# Patient Record
Sex: Female | Born: 1970 | Race: White | Hispanic: No | Marital: Married | Smoking: Current every day smoker
Health system: Southern US, Community
[De-identification: ages and names within clinical notes are randomized; demographics above are authoritative.]

## PROBLEM LIST (undated history)

## (undated) HISTORY — PX: ABDOMINAL HYSTERECTOMY: SHX81

## (undated) HISTORY — PX: TUBAL LIGATION: SHX77

---

## 1997-07-26 ENCOUNTER — Ambulatory Visit (HOSPITAL_COMMUNITY): Admission: RE | Admit: 1997-07-26 | Discharge: 1997-07-26 | Payer: Self-pay | Admitting: Orthopedic Surgery

## 1997-07-28 ENCOUNTER — Emergency Department (HOSPITAL_COMMUNITY): Admission: EM | Admit: 1997-07-28 | Discharge: 1997-07-28 | Payer: Self-pay

## 1997-07-29 ENCOUNTER — Ambulatory Visit (HOSPITAL_COMMUNITY): Admission: RE | Admit: 1997-07-29 | Discharge: 1997-07-29 | Payer: Self-pay | Admitting: Internal Medicine

## 1997-07-31 ENCOUNTER — Inpatient Hospital Stay (HOSPITAL_COMMUNITY): Admission: EM | Admit: 1997-07-31 | Discharge: 1997-08-02 | Payer: Self-pay | Admitting: Orthopedic Surgery

## 1997-12-23 ENCOUNTER — Encounter: Admission: RE | Admit: 1997-12-23 | Discharge: 1998-03-23 | Payer: Self-pay | Admitting: Anesthesiology

## 2000-03-18 ENCOUNTER — Other Ambulatory Visit: Admission: RE | Admit: 2000-03-18 | Discharge: 2000-03-18 | Payer: Self-pay | Admitting: Obstetrics and Gynecology

## 2000-03-21 ENCOUNTER — Encounter (INDEPENDENT_AMBULATORY_CARE_PROVIDER_SITE_OTHER): Payer: Self-pay

## 2000-03-21 ENCOUNTER — Other Ambulatory Visit: Admission: RE | Admit: 2000-03-21 | Discharge: 2000-03-21 | Payer: Self-pay | Admitting: Obstetrics and Gynecology

## 2000-09-12 ENCOUNTER — Inpatient Hospital Stay (HOSPITAL_COMMUNITY): Admission: AD | Admit: 2000-09-12 | Discharge: 2000-09-13 | Payer: Self-pay | Admitting: Obstetrics & Gynecology

## 2001-06-23 ENCOUNTER — Encounter: Admission: RE | Admit: 2001-06-23 | Discharge: 2001-06-23 | Payer: Self-pay | Admitting: Internal Medicine

## 2001-09-01 ENCOUNTER — Encounter: Admission: RE | Admit: 2001-09-01 | Discharge: 2001-09-01 | Payer: Self-pay | Admitting: *Deleted

## 2001-09-01 ENCOUNTER — Other Ambulatory Visit: Admission: RE | Admit: 2001-09-01 | Discharge: 2001-09-01 | Payer: Self-pay | Admitting: *Deleted

## 2001-09-20 ENCOUNTER — Ambulatory Visit (HOSPITAL_COMMUNITY): Admission: RE | Admit: 2001-09-20 | Discharge: 2001-09-20 | Payer: Self-pay | Admitting: *Deleted

## 2001-09-20 ENCOUNTER — Encounter (INDEPENDENT_AMBULATORY_CARE_PROVIDER_SITE_OTHER): Payer: Self-pay

## 2001-10-13 ENCOUNTER — Encounter: Admission: RE | Admit: 2001-10-13 | Discharge: 2001-10-13 | Payer: Self-pay | Admitting: *Deleted

## 2002-07-02 ENCOUNTER — Encounter: Admission: RE | Admit: 2002-07-02 | Discharge: 2002-07-02 | Payer: Self-pay | Admitting: Internal Medicine

## 2002-10-18 ENCOUNTER — Encounter: Admission: RE | Admit: 2002-10-18 | Discharge: 2002-10-18 | Payer: Self-pay | Admitting: Internal Medicine

## 2004-09-14 ENCOUNTER — Ambulatory Visit (HOSPITAL_COMMUNITY): Admission: RE | Admit: 2004-09-14 | Discharge: 2004-09-14 | Payer: Self-pay | Admitting: Emergency Medicine

## 2004-09-14 ENCOUNTER — Emergency Department (HOSPITAL_COMMUNITY): Admission: EM | Admit: 2004-09-14 | Discharge: 2004-09-14 | Payer: Self-pay | Admitting: Emergency Medicine

## 2004-09-22 ENCOUNTER — Other Ambulatory Visit: Admission: RE | Admit: 2004-09-22 | Discharge: 2004-09-22 | Payer: Self-pay | Admitting: Obstetrics & Gynecology

## 2004-10-14 ENCOUNTER — Ambulatory Visit (HOSPITAL_COMMUNITY): Admission: RE | Admit: 2004-10-14 | Discharge: 2004-10-14 | Payer: Self-pay | Admitting: Obstetrics & Gynecology

## 2004-11-25 ENCOUNTER — Encounter: Admission: RE | Admit: 2004-11-25 | Discharge: 2004-11-25 | Payer: Self-pay | Admitting: Gastroenterology

## 2004-12-07 ENCOUNTER — Encounter (INDEPENDENT_AMBULATORY_CARE_PROVIDER_SITE_OTHER): Payer: Self-pay | Admitting: Specialist

## 2004-12-07 ENCOUNTER — Inpatient Hospital Stay (HOSPITAL_COMMUNITY): Admission: RE | Admit: 2004-12-07 | Discharge: 2004-12-09 | Payer: Self-pay | Admitting: Obstetrics & Gynecology

## 2005-10-01 ENCOUNTER — Emergency Department (HOSPITAL_COMMUNITY): Admission: EM | Admit: 2005-10-01 | Discharge: 2005-10-01 | Payer: Self-pay | Admitting: Emergency Medicine

## 2005-10-04 ENCOUNTER — Encounter (HOSPITAL_COMMUNITY): Admission: RE | Admit: 2005-10-04 | Discharge: 2005-11-04 | Payer: Self-pay | Admitting: Emergency Medicine

## 2008-02-05 ENCOUNTER — Emergency Department (HOSPITAL_COMMUNITY): Admission: EM | Admit: 2008-02-05 | Discharge: 2008-02-05 | Payer: Self-pay | Admitting: Emergency Medicine

## 2008-02-06 ENCOUNTER — Encounter (INDEPENDENT_AMBULATORY_CARE_PROVIDER_SITE_OTHER): Payer: Self-pay | Admitting: Obstetrics & Gynecology

## 2008-02-06 ENCOUNTER — Observation Stay (HOSPITAL_COMMUNITY): Admission: AD | Admit: 2008-02-06 | Discharge: 2008-02-07 | Payer: Self-pay | Admitting: Obstetrics & Gynecology

## 2008-08-10 ENCOUNTER — Emergency Department (HOSPITAL_COMMUNITY): Admission: EM | Admit: 2008-08-10 | Discharge: 2008-08-10 | Payer: Self-pay | Admitting: Emergency Medicine

## 2010-03-14 ENCOUNTER — Encounter: Payer: Self-pay | Admitting: Gastroenterology

## 2010-06-01 LAB — URINALYSIS, ROUTINE W REFLEX MICROSCOPIC
Glucose, UA: NEGATIVE mg/dL
Ketones, ur: 15 mg/dL — AB
Nitrite: NEGATIVE
Protein, ur: NEGATIVE mg/dL
Specific Gravity, Urine: 1.02 (ref 1.005–1.030)
Urobilinogen, UA: 1 mg/dL (ref 0.0–1.0)
pH: 6 (ref 5.0–8.0)

## 2010-06-01 LAB — POCT I-STAT, CHEM 8
BUN: 10 mg/dL (ref 6–23)
Calcium, Ion: 1.11 mmol/L — ABNORMAL LOW (ref 1.12–1.32)
Chloride: 105 mEq/L (ref 96–112)
Creatinine, Ser: 0.9 mg/dL (ref 0.4–1.2)
Glucose, Bld: 80 mg/dL (ref 70–99)
HCT: 39 % (ref 36.0–46.0)
Hemoglobin: 13.3 g/dL (ref 12.0–15.0)
Potassium: 3.2 mEq/L — ABNORMAL LOW (ref 3.5–5.1)
Sodium: 141 mEq/L (ref 135–145)
TCO2: 26 mmol/L (ref 0–100)

## 2010-06-01 LAB — GC/CHLAMYDIA PROBE AMP, GENITAL
Chlamydia, DNA Probe: NEGATIVE
GC Probe Amp, Genital: NEGATIVE

## 2010-06-01 LAB — URINE MICROSCOPIC-ADD ON

## 2010-06-01 LAB — WET PREP, GENITAL
Trich, Wet Prep: NONE SEEN
WBC, Wet Prep HPF POC: NONE SEEN
Yeast Wet Prep HPF POC: NONE SEEN

## 2010-06-01 LAB — RPR: RPR Ser Ql: NONREACTIVE

## 2010-06-01 LAB — PREGNANCY, URINE: Preg Test, Ur: NEGATIVE

## 2010-07-07 NOTE — Op Note (Signed)
Pam, Warner        ACCOUNT NO.:  [id number]   MEDICAL RECORD NO.:  [id number]          PATIENT TYPE:  OBV   LOCATION:  9307                          FACILITY:  [facility]   PHYSICIAN:  [name], M.D.   DATE OF BIRTH:  January 17, 1971   DATE OF PROCEDURE:  02/06/2008  DATE OF DISCHARGE:                               OPERATIVE REPORT   PREOPERATIVE DIAGNOSIS:  Painful pelvic mass consistent with a torsed  right ovary.   POSTOPERATIVE DIAGNOSIS:  Hemorrhagic right ovarian cyst.   PROCEDURE:  Laparoscopic right salpingo-oophorectomy, cystoscopy   SURGEON:  Pam Mori, MD   ASSISTANT:  Gerrit Friends. Aldona Bar, MD   ANESTHESIA:  General endotracheal.   ESTIMATED BLOOD LOSS:  Minimal.   COMPLICATIONS:  None.   FINDINGS:  There was a 10-cm right ovarian cyst in the central lower  pelvic area.  This cyst was densely adherent to underlying bowel and to  the pelvic sidewall.  No evidence of torsion was seen.  The rest of the  pelvis appeared normal with no lesions noted.   INDICATIONS:  This is a 40 year old gravida 2, para 2, female status  post total abdominal hysterectomy and left salpingo-oophorectomy for  pelvic pain and adenomyosis approximately 3 years ago, who presented  with acute onset of severe lower abdominal pain for approximately 48  hours.  The patient denied fevers or chills.  Denied constipation or  diarrhea, though she had some nausea which she related to the pain.  She  was seen in the emergency room on the evening of December 14, where a  CAT scan showed a 10-cm pelvic mass.  Diagnosis of ovarian cyst was  made.  The patient was stable on pain medication and returned to the  office on December 15 for an ultrasound.  The ultrasound confirmed the  presence of the mass.  The patient still complained of moderate-to-  severe pain, which was just barely controlled with oral narcotic  medication.  The diagnosis of torsed adnexal mass was entertained. The  decision  was made to proceed with immediate surgery based upon the fact  that this was perhaps a torsion and that the patient was in a  significant amount of pain not controlled with oral analgesia.   PROCEDURE IN DETAIL:  The patient was taken to the operating room and  general endotracheal anesthesia was induced.  She was placed in the  modified dorsal lithotomy position.  The abdomen and perineum were  prepped and draped in the sterile fashion.  The bladder was  catheterized.  The surgery regowned and gloved.  An incision was made at  the base of the umbilicus and an open entry into the peritoneal cavity  was performed.  A Hasson cannula was then placed and then the  laparoscope was introduced.  The pneumoperitoneum was created.  Accessory instruments were placed in the left and right lower quadrants.  The pelvis was viewed with the findings noted above.  The pelvic mass  was mobilized and adhesions were noted.  These adhesions were taken down  sharply with the scissors.  The infundibulopelvic ligament was  identified, cauterized, and cut  with a tripolar instrument and this was  continued out until the infundibular ligaments were completely severed.  The cyst was still densely adherent to the right pelvic sidewall and the  dissection was carried out as close as possible to the side wall without  injuring any vital structures or the underlying bowel, which was also  densely adherent to the cyst.  Finally, the cyst was freed from its  adhesions and vasculature, and was placed in the cul-de-sac.  The pelvis  was viewed.  There appeared to be the possibility of some ovarian tissue  remnant firmly adherent to the right pelvic sidewall.  A decision was  made not to attempt to dissect these free for fear of creating further  injury.  The cyst was then placed in an Endobag, which was placed  through the umbilical incision, while a 5-mm camera was placed through  the right lower quadrant port.  The bag  was then brought through the  umbilical incision and the specimen was morcellated and the remaining  specimen and the endobag was removed intact.  The Hasson cannula was  then replaced and the 10-mm scope was replaced, so that the pelvis could  be viewed.  The operative bed was thoroughly irrigated and was noted to  be hemostatic, and the rest of the pelvis was viewed and noticed to be  without lesions.  At that point, the procedure was terminated.  The gas  was allowed to escape.  The umbilical fascia was closed with a 0 Prolene  suture.  The skin at the umbilical incision was closed with subcuticular  4-0 Vicryl suture.  The left and right lower quadrant 5-mm ports were  closed with Dermabond.  The decision was made to proceed with cystoscopy  because of the inability to visualize the ureter at the site of the  right adnexal surgery.  The indigo carmine dye was placed intravenously  and the cystoscope was placed.  The ureteral orifices were easily  visualized and fluid was clearly seen pulsating through both the  ureteral orifices indicating patency. A small polyp was also noted on  the anterior bladder wall. The cystoscopy was then terminated at that  point as was the general procedure.  The patient left the operating room  in good condition.      [name], M.D.  Electronically Signed     RK/MEDQ  D:  02/06/2008  T:  02/07/2008  Job:  [id number]

## 2010-07-10 NOTE — Op Note (Signed)
NAMEBREYONNA, NAULT        ACCOUNT NO.:  [id number]   MEDICAL RECORD NO.:  [id number]          PATIENT TYPE:  AMB   LOCATION:  SDC                           FACILITY:  [facility]   PHYSICIAN:  Gerrit Friends. Aldona Bar, M.D.   DATE OF BIRTH:  1970/08/14   DATE OF PROCEDURE:  12/07/2004  DATE OF DISCHARGE:                                 OPERATIVE REPORT   PREOPERATIVE DIAGNOSIS:  Persistent left lower quadrant pain, suspect  adenomyosis.   POSTOPERATIVE DIAGNOSES:  Persistent left lower quadrant pain, suspect  adenomyosis.  Pathology pending.   PROCEDURE:  Total abdominal hysterectomy, left salpingo-oophorectomy,  removal of right Falope ring.   SURGEON:  Gerrit Friends. Aldona Bar, M.D.   ASSISTANT:  Dr. Malva Limes.   ANESTHESIA:  Was general endotracheal.   PROCEDURE:  The patient was taken to the operating where after satisfactory  induction of general endotracheal anesthesia, she was prepped and draped  having been placed in the supine position. Foley catheter was placed as part  of prep.   Once the patient had been adequately draped, procedure was begun.   A Pfannenstiel incision was made and with minimal difficulty dissected down  sharply to and through the fascia in low transverse fashion with hemostasis  created at each layer. Subfascial space was created inferiorly and  superiorly, muscles separated midline. Peritoneum identified and entered  appropriately with care taken to avoid the bowel superiorly and the bladder  inferiorly. Once the peritoneum was opened, the abdomen was inspected. The  appendix was palpably and visibly normal as was the upper abdomen. At this  time the self-retaining O'Connor-O'Sullivan retractor was placed and bowel  was packed off without difficulty. The pelvis looked for the most part  normal although the uterus did have a somewhat mottled appearance not  totally and consistent with adenomyosis. At this time hysterectomy was begun  in usual fashion. Two  long Kelly clamps were placed at the corners of the  uterus and with minimal difficulty, the round ligaments bilaterally were  suture secured and divided creating a bladder flap anteriorly. At this time  the left infundibulopelvic pedicle was identified, skeletonized - small  bowel adhesions were sharply taken down and after identification of the left  ureter, the infundibulopelvic pedicle on the left was clamped, cut and  doubly suture secured with 0 Vicryl suture. A decision was made to leave the  right ovary in as previously discussed with the patient but remove the right  Falope ring. Accordingly a clamp was placed on the ovarian ligament  encompassing the Falope ring on the right fallopian tube and after that  pedicle was cut, it was doubly suture secured with 0 Vicryl suture as well.  At this time the uterine artery pedicles were skeletonized and then using  curved clamps, the pedicle developed, suture secured with 0 Vicryl suture.  Bilateral pedicles were taken in similar fashion, down to the level of the  vagina and cervix in a similar fashion. The vaginal angles were then secured  and Heaney sutured with 0 Vicryl suture. The uterus and cervix were then  removed and the vaginal cuff closed with  interrupted 0 Vicryl in figure-of-  eight fashion. The ureters had been appropriately identified bilaterally.  Irrigation was carried out and hemostasis created. At this time procedure  was felt to be complete. The right ovary was well suspended to the right  round ligament pedicle. At this time all packs were removed. All counts  noted to be correct. No foreign bodies noted in the abdominal cavity and  closure of the abdomen was begun in layers. The abdominal peritoneum was  closed with 0 Vicryl running fashion. Muscles secured with same. Assured of  good subfascial hemostasis, the fascia was then reapproximated using 0  Vicryl from angle to midline bilaterally. Assured of good subcutaneous   hemostasis, the skin was then closed with staples and sterile pressure  dressing was applied. The patient at this time was transported to recovery  room in satisfactory having tolerated the procedure well. Estimated blood  loss 150 mL.  All counts correct x2. The patient was taken to the recovery  in satisfactory condition.      Gerrit Friends. Aldona Bar, M.D.  Electronically Signed     RMW/MEDQ  D:  12/07/2004  T:  12/07/2004  Job:  [id number]

## 2010-07-10 NOTE — Op Note (Signed)
NAMEHANNELORE, BOVA        ACCOUNT NO.:  [id number]   MEDICAL RECORD NO.:  [id number]          PATIENT TYPE:  AMB   LOCATION:  SDC                           FACILITY:  [facility]   PHYSICIAN:  Gerrit Friends. Aldona Bar, M.D.   DATE OF BIRTH:  September 08, 1970   DATE OF PROCEDURE:  10/14/2004  DATE OF DISCHARGE:                                 OPERATIVE REPORT   PREOPERATIVE DIAGNOSIS:  Chronic left lower quadrant pain.   POSTOPERATIVE DIAGNOSIS:  Chronic left lower quadrant pain.   PROCEDURE:  Diagnostic laparoscopy.   SURGEON:  Gerrit Friends. Aldona Bar, M.D.   ANESTHESIA:  General endotracheal.   PROCEDURE:  The patient was taken to the operating room, where after the  satisfactory induction of general endotracheal anesthesia she was prepped  and draped having been in the short Allen stirrups in the modified lithotomy  position in the usual manner for laparoscopy.  The bladder was drained of  clear urine with a red rubber catheter in and in-and-out fashion as part of  the prep and a Hulka tenaculum applied and cervix for uterine mobility  during the procedure.   Laparoscopy was begun usual fashion.  A 1 cm subumbilical midline transverse  skin incision was made and through this the trocar sleeve were introduced.  The trocar was removed and through the sleeve the laparoscope introduced  with good visualization of intra-abdominal structures.  Pneumoperitoneum was  created at this time with approximately 3 liters of carbon dioxide gas.   The abdomen and pelvis were inspected.  This was aided by placement of an  accessory trocar through a 5 mm midline suprapubic transverse skin incision,  through which the accessory trocar and sleeve was introduced without  difficulty under direct visualization.  Once the accessory trocar was  removed, through the accessory sleeve a grasping instrument was introduced.  Pelvic structures were then inspected thoroughly.  Liver edge and the  surface of the diaphragm  appeared normal.  The appendix was not visualized,  presumably very retrocecal and unable to be mobilized.  There was a spot of  what appeared to be endometriosis on the right pelvic sidewall very much  away from where the patient was complaining of chronic left lower quadrant  pain.  The uterus appeared a little mottled in appearance but was in general  upper limits of normal size and mobile.  Bladder flap was without lesion, as  was the cul-de-sac.  There was some physiologic fluid noted in the cul-de-  sac emanating from the right ovary, which had a corpus luteum cyst,  consistent with the patient's menstrual cycle.  There was a Falope ring  appropriately located on the right fallopian tube and a second Falope ring  on the left fallopian tube, more in the area just adjacent to the cornu of  the uterus.  There was one small adhesion between that Falope ring, which  was essentially was covered over by tubal epithelium, and the round  ligament, and this was sharply taken down with excellent hemostasis.  The  left ovary, right ovary, posterior leaves of both broad ligaments, both  fallopian tubes, intestinal contents essentially  appeared completely normal.  There was no adequate exhalation to the patient's left lower quadrant pain  unless a small adhesion between the placement of the Falope ring on the left  fallopian tube and the left round ligament was a possible etiology.  This  adhesion was so small incision and so easily taken down that I am unsure as  to whether this could explain the patient's chronic pain.   At this time the physiologic fluid in the cul-de-sac was aspirated through  the accessory sleeve with the appropriate instrument and thereafter 0.5%  Marcaine was instilled into the cul-de-sac and over the area of the left  tube and ovary, an amount amounting to approximately 30 mL was instilled.  At this time as no pathology essentially appreciated, the procedure was felt  to  be complete and was terminated.  The accessory sleeve was removed under  direct visualization and the undersurface of the peritoneum was dry.  The  pneumoperitoneum was reduced.  The accessory incision under the peritoneum  remained dry.  The laparoscope was removed.  Pneumoperitoneum totally reduced, large sleeve  removed, and skin incisions were closed with 3-0 Vicryl in interrupted  subcuticular fashion.  Hulka tenaculum was removed from the cervix and the  patient at this time was transported to recovery in satisfactory condition,  having tolerated the procedure well.   The patient will be discharged home with prescription for Tylox to use one  or two every four to six hours as needed for pain and Phenergan 25 mg rectal  suppositories to use one every four hours per rectum as needed for nausea  and vomiting.  She will be asked to return the office in one week's time for  follow-up or as needed.  Appropriate instructions for the incision sites  will likewise be given to the patient.   In summary, the only pathologic findings that were noted were a spot of  endometriosis on the right pelvic sidewall and one very small adhesion  between the placement of the Falope ring on the left fallopian tube and the  left round ligament, which was taken down.  There was a physiologic corpus  luteum on the right ovary, and there was some physiologic fluid in the cul-  de-sac which was aspirated and discarded.   Condition on arrival to recovery satisfactory.      Gerrit Friends. Aldona Bar, M.D.  Electronically Signed     RMW/MEDQ  D:  10/14/2004  T:  10/14/2004  Job:  [id number]

## 2010-07-10 NOTE — H&P (Signed)
NAMEMARGART, ZEMANEK        ACCOUNT NO.:  [id number]   MEDICAL RECORD NO.:  [id number]          PATIENT TYPE:  AMB   LOCATION:  SDC                           FACILITY:  [facility]   PHYSICIAN:  Gerrit Friends. Aldona Bar, M.D.   DATE OF BIRTH:  11-02-1970   DATE OF ADMISSION:  10/14/2004  DATE OF DISCHARGE:                                HISTORY & PHYSICAL   OFFICE NUMBER:  [id number].   This patient is having surgery on the 23rd of August at [facility] at 12:30  p.m.   HISTORY:  [name] is a 40 year old, gravida 2, para 2, white  female undergoing diagnostic laparoscopy for left lower quadrant pain.   This patient was seen by me for the first time in several years on the 31st  of July, relating left lower quadrant pain for about a 4 month duration.  Apparently she was seen at Urgent Care and referred to urology because of  findings consistent with possibility of a kidney stone in the left ureter.  She had a work-up which consisted of a CT of the pelvis and abdomen without  contrast and subsequent to that had what sounds like an IVP done by Dr.  Barron Alvine which essentially ruled out a ureteral stone on the left even  though the patient has persistent microscopic hematuria.  She has had a  tubal sterilization procedure - this was done in the year 2003,  laparoscopically, with placement of Falope rings.   She has been having fairly regular cycles - she had a period on the 10th of  July and another period most recently around the 10th of August - her cycles  are regular but occasionally heavy with clotting.  This pain she describes  as coming and going, worse with movement, and worse she is on her feet and  seems to be confined to the left flank and left lower quadrant area.   When I saw her on the 31st of July, other than tenderness in the left  adnexal area, her exam was unremarkable.  She did have cultures done at that  visit, and both were negative.  She likewise had a CBC which  was  unremarkable and a comprehensive metabolic profile which was unremarkable.  An ultrasound also on that date revealed a left ovary measuring 3.3 cm x  1.97 cm x 2.32 cm, and there was a small cyst in the left ovary.  The  endometrial thickness was normal, and the right ovary appeared normal.   As mentioned, she did have a CT of the abdomen without contrast coupled with  a CT of the pelvis without contrast on the 24th of July, and no definite  distal ureteral stones were seen, and there was a small left ovarian cyst,  consistent with what was seen on her more recent ultrasound that was seen.   After seeing her on the 31st of July because of the microscopic hematuria, I  did have her return and see Dr. Isabel Caprice after I discussed with Dr. Isabel Caprice my  concerns, and patient returned to see me again on August 7th after having  seen Dr. Isabel Caprice  a second time and definitely ruling out, to the best of his  ability, any urologic problem, even though she continues to have microscopic  hematuria.  When I saw her on August 7th, her exam was relatively  unremarkable, although she was still complaining of some left lower quadrant  pain, saying that she could not work because of left lower quadrant pain.  Serum pregnancy test was done and was completely negative.   She began her period several days later and called back as directed because  of persistent discomfort, requesting a laparoscopy which I had previously  discussed with her.  She is now being taken to the operating room for a  diagnostic laparoscopy to rule out any gynecologic pathology.  It is of note  that a second ultrasound was done at her preoperative visit in the office on  the 17th of August.  And again, no significant pathology was seen.  The  right ovary actually had a cyst also at this time, but the cyst was small.  Both ovaries really essentially appeared normal.  There was no free fluid,  and the endometrial thickness was normal as  was the contour of the uterus.   The patient is now being taken to the operating room for a diagnostic  laparoscopy because of persistent left lower quadrant pain.   ALLERGIES:  SULFA.  She is also allergic to some type of CONTRAST FOR  RADIOLOGIC PROCEDURES.   PREVIOUS OPERATIVE PROCEDURES:  Laparoscopic tubal done in 2003 - Falope  rings.   Family history, social history, review of systems noncontributory with the  exception of above.   PHYSICAL EXAMINATION AT TIME OF ADMISSION:  GENERAL:  A well-developed  female who does not appear to be in any acute distress.  VITAL SIGNS:  Weight 154, height 5 feet 10.5 inches.  Blood pressure 110/62,  pulse 80 and regular, respirations 18 and regular, temperature 98.2.  HEENT:  Negative.  Thyroid not enlarged.  CHEST:  Clear.  CARDIOVASCULAR:  No murmur.  BREASTS:  Negative.  ABDOMEN:  Essentially unremarkable except for some deep tenderness in the  left lower quadrant.  There is no rebound.  Bowel sounds are well heard.  PELVIC EXAMINATION:  The uterus mobile, nontender, and both adnexal areas  are relatively nontender and feel normal with no abnormal masses palpated.  EXTREMITIES:  Negative.  NEUROLOGIC:  Physiologic.   IMPRESSION:  Chronic left lower quadrant pain.   PLAN:  Diagnostic laparoscopy.      Gerrit Friends. Aldona Bar, M.D.  Electronically Signed     RMW/MEDQ  D:  10/09/2004  T:  10/09/2004  Job:  [id number]

## 2010-07-10 NOTE — H&P (Signed)
NAMEAMRITHA, Warner        ACCOUNT NO.:  [id number]   MEDICAL RECORD NO.:  [id number]          PATIENT TYPE:  AMB   LOCATION:  SDC                           FACILITY:  [facility]   PHYSICIAN:  Gerrit Friends. Aldona Bar, M.D.   DATE OF BIRTH:  06-Nov-1970   DATE OF ADMISSION:  12/07/2004  DATE OF DISCHARGE:                                HISTORY & PHYSICAL   OFFICE NUMBER:  [id number].   HISTORY:  [name] is a 40 year old, gravida 2, para 2, female  being admitted for a total abdominal hysterectomy and left salpingo-  oophorectomy with a preoperative diagnosis of possible/probable adenomyosis,  endometriosis, and left lower quadrant pain.   Her history begins in July of 2006 when she had the onset of left lower  quadrant pain and presented to an urgent care center.  She has a history of  ureteral stones, and this is what, indeed, she thought she had at the time  she presented to the urgent care center.  She did have some microscopic  hematuria, in addition.  She had a work-up which included a CT which was  apparently negative for stones.  Nonetheless, she was seen by urology, and,  in spite of her microscopic hematuria, had a relatively negative evaluation.  She, thereafter, saw me, thinking that she was having some left lower  quadrant pain related to her left ovary.  She had had a previous  sterilization procedure done elsewhere about 3 years ago - a laparoscopic  Falope ring procedure, and, otherwise, with the exception of her 2 normal  spontaneous deliveries 4 and 8 years earlier, had a relatively uncomplicated  gynecologic history.  She did relate heavy periods with clotting and  cramping, but this left lower quadrant pain was something that was really  kind of additional.  She also related some constipation, but did not really  seem to associate the left lower quadrant pain with the constipation.  When  she was seen by me initially at the very end of July, again she had  microscopic hematuria, and she did have some minimal tenderness in the left  adnexa.  I did have her re-seen by Dr. Isabel Caprice, her urologist, think that her  history was more compatible with a stone than otherwise, and his further  evaluation likewise, again, revealed nothing to suggest a ureteral stone.  She returned to see me again about 10 days later with continued left lower  quadrant pain, to the point where ultimately after several normal  ultrasounds she was taken to the operating room on October 14, 2004 for a  diagnostic laparoscopy.  The uterus had a somewhat mottled appearance, not  totally inconsistent with adenomyosis.  The left ovary looked pretty  reasonable, as did the right ovary.  There was one old scarred area on the  pelvic side wall on the right that was consistent with mild endometriosis.  Otherwise, a laparoscopic evaluation was fairly unremarkable.  Unfortunately, several days after laparoscopy, the patient's left lower  quadrant pain returned.  Her pain has been such that she really claims to be  totally unable to work - she works as  a Production designer, theatre/television/film at night - and  has essentially been out of work since the onset of this problem at the end  of July.  When she was seen by me postoperatively on October 21, 2004, the  decision was made to try her on a low-dose birth control pill to see if this  had any effect on her lower abdominal pain.  Other than some breakthrough  bleeding initially when she started the low-dose birth control pill, she has  done fairly well, although she does admit to having a period when she is  supposed to, albeit light, but says that during that time she does have some  fairly impressive cramping, not dissimilar to what she had prior to starting  the birth control pill.  In addition, with some exertion, she continues to  have some left lower quadrant discomfort.  In September, she did undergo a  fairly thorough gastrointestinal investigation by  Dr. Madilyn Fireman, and it was his  feeling that she had some symptoms related to her fairly significant  constipation, and she was begun on Zelnorm, and basically had some  improvement of her left lower quadrant discomfort, although, as mentioned,  continued to have problems when she was having vaginal bleeding in terms of  not only left lower quadrant pain, but midline lower abdominal pain, as  well.  She returned to see me on December 02, 2004, really essentially  requesting some definite surgical intervention.  When she was seen by me  prior, we did talk about the possibility of an abdominal hysterectomy and a  left salpingo-oophorectomy.  After further talking with her in great detail  on December 02, 2004, what previously had been scheduled as a left salpingo-  oophorectomy on December 07, 2004 was changed to a total abdominal  hysterectomy and left salpingo-oophorectomy with the feeling that the  patient probably has some adenomyosis/endometriosis and possibly involvement  of the left ovary, as well.  So the plan is to remove her uterus  abdominally, take out her left tube and ovary, and remove the Falope rings  from the right fallopian tube, hopefully retaining her right ovary, with a  preoperative diagnosis of persistent dysmenorrhea and left lower quadrant  pain, presumably related to adenomyosis and endometriosis.   The patient is aware of the potential complications of surgery, but feels  that the potential benefit the surgery could offer her well outweighs any  risk associated with the surgery or anesthesia.   ALLERGIES:  The patient is allergic to SULFA.   SOCIAL HISTORY:  She is a smoker.  She smokes approximately half a pack a  day.   MEDICATIONS:  1.  She is currently on a low-dose birth control pill, which she will      continue up to the time of surgery.  Her last menstrual period was      approximately 2 weeks ago. 2.  She is also on Zelnorm twice daily from Dr. Madilyn Fireman, which  she will      continue up to the time of surgery.   PREVIOUS OPERATIVE PROCEDURES:  1.  Laparoscopic tubal approximately 3 years ago, done with Falope rings.  2.  Diagnostic laparoscopy done by me in August of 2006, as mentioned above.   FAMILY HISTORY/SOCIAL HISTORY:  Essentially noncontributory.   REVIEW OF SYSTEMS:  Essentially negative.   EXAMINATION AT THE TIME OF ADMISSION:  GENERAL:  A well-developed female.  VITAL SIGNS:  Weight 154.  Blood pressure 110/60.  Height  5 feet, 10.5  inches.  Temperature 98.2, pulse 80 and regular, respirations 18 and  regular.  HEENT:  Negative.  Thyroid not enlarged.  CHEST:  Clear to auscultation and percussion.  BREAST EVALUATION:  Negative.  CARDIOVASCULAR:  Regular rhythm without murmur.  ABDOMEN:  Abdomen is soft.  There is some mild lower abdominal tenderness to  deep palpation.  PELVIC:  External genitalia, BUS normal.  Introitus by digital vault fairly  well supported.  Cervix with no gross lesions.  (Pap smear in July of 2006  negative.)  Uterus is normal size, fairly immobile, slightly tender to  palpation.  Adnexal area is unremarkable with some slight tenderness to the  left adnexa.  EXTREMITIES:  Unremarkable.  NEUROLOGIC:  Physiologic.   IMPRESSION:  Probable adenomyosis/endometriosis - dysmenorrhea and left  lower quadrant pain.   PLAN:  The patient will undergo a total abdominal hysterectomy, left  salpingo-oophorectomy, and removal of Falope ring off the right fallopian  tube.  Her work-up has been extensive and included a urologic work-up, as  well as a GI work-up, as well as a previous laparoscopy.  The patient does  understand that the potential of having some pain after surgery exists  because of the somewhat confusing symptomatology with a laparoscopy that was  suggestive but  not conclusive of adenomyosis/endometriosis.  She feels that the potential  benefits from surgery, along with being able to get back into her  normal  lifestyle and work without the left lower quadrant pain and without the  significant dysmenorrhea, is well worth the potential risk of surgery.      Gerrit Friends. Aldona Bar, M.D.  Electronically Signed     RMW/MEDQ  D:  12/02/2004  T:  12/02/2004  Job:  [id number]   cc:   Valetta Fuller, M.D.  Fax: [phone]   Everardo All. Madilyn Fireman, M.D.  Fax: [phone]

## 2010-07-10 NOTE — Discharge Summary (Signed)
NAMEJAMMIE, [name]        ACCOUNT NO.:  [id number]   MEDICAL RECORD NO.:  [id number]          PATIENT TYPE:  INP   LOCATION:  9317                          FACILITY:  [facility]   PHYSICIAN:  Gerrit Friends. Aldona Bar, M.D.   DATE OF BIRTH:  [birth year]   DATE OF ADMISSION:  12/07/2004  DATE OF DISCHARGE:  12/09/2004                                 DISCHARGE SUMMARY   DISCHARGE DIAGNOSES:  1.  Adenomyosis of the uterus.  2.  Chronic left lower quadrant pain.   PROCEDURE:  Total abdominal hysterectomy and left salpingo-oophorectomy and  removal of Falope ring her right fallopian tube.   SUMMARY:  This is a 40 year old gravida 2, para 2 with chronic left lower  quadrant pain who had a thorough evaluation, both by a urologist and a  gastroenterologist, and ultimately was laparoscoped by me, as well. Findings  consistent with possible adenomyosis, but no real explanation for her left  lower quadrant pain was ultimately taken to the operating room on the day of  admission, December 07, 2004, for a total abdominal hysterectomy and left  salpingo-oophorectomy through a Pfannenstiel incision.  The procedure went  well.  During the procedure, the Falope ring was removed from the right  fallopian tube, as well. Both ovaries appeared normal. There were no  adhesions to speak of.  The uterus did have a mottled appearance consistent  with adenomyosis, and, indeed, pathologic report confirmed adenomyosis.  Pathology otherwise was benign. The patient's postoperative course was  totally benign. Her discharge hemoglobin was 11.8 with a white count 15,900,  platelet count of 160,000. On the morning of December 09, 2004, she was  ambulating well, tolerating a regular diet well, having normal bowel and  bladder function, was afebrile. Her wound was clean and dry, her pain was  well controlled, vital signs were stable, and she was ready for discharge.  Accordingly, she was discharged home with all appropriate  instructions.   DISCHARGE MEDICATIONS:  1.  Dilaudid 2 milligrams tablets one every 4 hours as needed for severe      pain.  2.  Motrin 600 milligrams every 6 hours as needed for less severe pain.  3.  She will continue her Zelnorm per Dr. Madilyn Fireman.   FOLLOW UP:  She will return the office in approximately three weeks' time  for follow-up. Her staples were removed and her wound was Steri-Stripped on  the morning of discharge.   CONDITION ON DISCHARGE:  Improved.      Gerrit Friends. Aldona Bar, M.D.  Electronically Signed     RMW/MEDQ  D:  12/09/2004  T:  12/09/2004  Job:  [id number]   cc:   Everardo All. Madilyn Fireman, M.D.  Fax: [phone]   Valetta Fuller, M.D.  Fax: [phone]

## 2010-07-10 NOTE — Op Note (Signed)
[name], [name]                  ACCOUNT NO.:  [id number]   MEDICAL RECORD NO.:  [id number]                   PATIENT TYPE:  AMB   LOCATION:  SDC                                  FACILITY:  [facility]   PHYSICIAN:  Enid Cutter, M.D.                  DATE OF BIRTH:  1970-05-08   DATE OF PROCEDURE:  09/20/2001  DATE OF DISCHARGE:                                 OPERATIVE REPORT   PREOPERATIVE DIAGNOSIS:  A 40 year old G2 P2 who desires permanent  sterilization and has a cervical polyp.   POSTOPERATIVE DIAGNOSIS:  A 40 year old G2 P2 who desires permanent  sterilization and has a cervical polyp.   PROCEDURE:  Bilateral tubal ligation with Falope rings and removal of  cervical polyp.   SURGEON:  Enid Cutter, M.D.   ANESTHESIA:  General endotracheal anesthesia.   COMPLICATIONS:  None.   SPECIMENS:  Cervical polyp.   ESTIMATED BLOOD LOSS:  15 cc.   DISPOSITION:  Recovery room, stable.   FINDINGS:  Normal uterus, tubes, and ovaries bilaterally; normal appendix  and normal liver edge visualized.   INDICATIONS FOR PROCEDURE:  The patient is a 40 year old G2 P2 who is  approximately one year status post spontaneous vaginal delivery who desire  permanent sterilization.  The patient was counseled on the risks and  benefits of permanent sterilization including risks of bleeding infection,  injury to internal organs, the risk of failure of 1 per 200, and the  increased risk of ectopic gestation.  In addition, she was counseled on the  permanence of this procedure and alternative forms of birth control.  The  patient desired to have permanent sterilization.  In addition, this patient  was found to have an approximately 3 mm cervical polyp on examination in the  office.  We agreed to remove the polyp at the time of surgery.   DESCRIPTION OF PROCEDURE:  The patient was taken to the operating where she  was placed under general endotracheal anesthesia.  She was placed in the  [place] stirrups and prepped and draped in sterile fashion.  A speculum was  placed in the vagina and the cervical polyp was visualized.  A ring forceps  was used to grasp the polyp and it was twisted at its base until the polyp  was removed.  An acorn cannula was then placed in the cervix after the  anterior lip of the cervix was grasped with single-tooth tenaculum.  The  bladder was drained with a red rubber catheter throughout the procedure.  Attention was then turned to the abdomen where the umbilicus was injected  with 0.5% Marcaine.  There was a previous scar in her lower abdomen in the  midline from having a skin lesion removed.  She desired to have this be the  operative site for the second trocar.  Marcaine was injected in this area as  well.  An incision was made at the umbilicus  and the various needles  inserted.  The abdomen was insufflated with CO2 gas.  An incision was made  through the previous scar and an  8 mm trocar was introduced into the lower abdomen.  The pelvic anatomy was  visualized and she had normal uterus, tubes, and ovaries; a normal anterior  and posterior cul-de-sac.  There was visualization of the appendix and it  was seen to be normal.  The upper right abdomen was visualized and the liver  edge was seen; however, the gallbladder was not completely visualized.  These all appeared normal.  The Falope ring was applied to the applicator  and the patient's right fallopian tube was identified by the fimbriated end.  It was grasped approximately 2 cm from the cornual region and the Falope  ring was applied without difficulty.  An appropriate slit and blanching of  the tube was noted and attention was then turned to the patient's left  fallopian tube.  The Falope ring applicator was then used to apply a Falope  ring again to the patient's left fallopian tube after identifying the  fimbriated end of the tube.  Again, a knuckle of the tube was obtained with  an  appropriate slit and blanche approximately 2 cm from the cornual region.  The tubes were hemostatic.  The suprapubic trocar was removed under direct  visualization.  The CO2 gas was emptied and the umbilical trocar was  removed.  The incisions were then closed with a subcuticular stitch of 3-0  Vicryl.  The instruments were removed from the vagina.  There was some  oozing of the anterior lip of the cervix and pressure was held initially,  and then silver nitrate was applied.  The patient was awakened and taken to  the recovery room in stable condition.                                               Enid Cutter, M.D.    EMH/MEDQ  D:  09/20/2001  T:  09/25/2001  Job:  [id number]

## 2010-11-26 LAB — COMPREHENSIVE METABOLIC PANEL
ALT: 10 U/L (ref 0–35)
AST: 13 U/L (ref 0–37)
Albumin: 3.1 g/dL — ABNORMAL LOW (ref 3.5–5.2)
Alkaline Phosphatase: 45 U/L (ref 39–117)
BUN: 4 mg/dL — ABNORMAL LOW (ref 6–23)
CO2: 28 mEq/L (ref 19–32)
Calcium: 8.3 mg/dL — ABNORMAL LOW (ref 8.4–10.5)
Chloride: 102 mEq/L (ref 96–112)
Creatinine, Ser: 0.74 mg/dL (ref 0.4–1.2)
GFR calc Af Amer: >60 mL/min (ref >60)
GFR calc non Af Amer: >60 mL/min (ref >60)
Glucose, Bld: 104 mg/dL — ABNORMAL HIGH (ref 70–99)
Potassium: 3.2 mEq/L — ABNORMAL LOW (ref 3.5–5.1)
Sodium: 136 mEq/L (ref 135–145)
Total Bilirubin: 1.4 mg/dL — ABNORMAL HIGH (ref 0.3–1.2)
Total Protein: 5.9 g/dL — ABNORMAL LOW (ref 6.0–8.3)

## 2010-11-26 LAB — CBC
HCT: 31.4 % — ABNORMAL LOW (ref 36.0–46.0)
HCT: 36.6 % (ref 36.0–46.0)
Hemoglobin: 11 g/dL — ABNORMAL LOW (ref 12.0–15.0)
Hemoglobin: 12.7 g/dL (ref 12.0–15.0)
MCHC: 34.6 g/dL (ref 30.0–36.0)
MCHC: 34.9 g/dL (ref 30.0–36.0)
MCV: 91.5 fL (ref 78.0–100.0)
MCV: 92.2 fL (ref 78.0–100.0)
Platelets: 117 10*3/uL — ABNORMAL LOW (ref 150–400)
Platelets: 141 10*3/uL — ABNORMAL LOW (ref 150–400)
RBC: 3.41 MIL/uL — ABNORMAL LOW (ref 3.87–5.11)
RBC: 4 MIL/uL (ref 3.87–5.11)
RDW: 13 % (ref 11.5–15.5)
RDW: 13.1 % (ref 11.5–15.5)
WBC: 7.2 10*3/uL (ref 4.0–10.5)
WBC: 8.7 10*3/uL (ref 4.0–10.5)

## 2010-11-27 LAB — URINALYSIS, ROUTINE W REFLEX MICROSCOPIC
Glucose, UA: NEGATIVE mg/dL
Ketones, ur: 15 mg/dL — AB
Nitrite: NEGATIVE
Protein, ur: 30 mg/dL — AB
Specific Gravity, Urine: 1.033 — ABNORMAL HIGH (ref 1.005–1.030)
Urobilinogen, UA: 1 mg/dL (ref 0.0–1.0)
pH: 5.5 (ref 5.0–8.0)

## 2010-11-27 LAB — COMPREHENSIVE METABOLIC PANEL
AST: 16 U/L (ref 0–37)
Albumin: 3.8 g/dL (ref 3.5–5.2)
Alkaline Phosphatase: 49 U/L (ref 39–117)
BUN: 9 mg/dL (ref 6–23)
CO2: 25 mEq/L (ref 19–32)
Chloride: 104 mEq/L (ref 96–112)
Creatinine, Ser: 0.72 mg/dL (ref 0.4–1.2)
GFR calc non Af Amer: >60 mL/min (ref >60)
Potassium: 4.1 mEq/L (ref 3.5–5.1)
Total Bilirubin: 1.9 mg/dL — ABNORMAL HIGH (ref 0.3–1.2)

## 2010-11-27 LAB — CBC
HCT: 43.2 % (ref 36.0–46.0)
Hemoglobin: 14.8 g/dL (ref 12.0–15.0)
MCHC: 34.2 g/dL (ref 30.0–36.0)
MCV: 91.4 fL (ref 78.0–100.0)
Platelets: 177 10*3/uL (ref 150–400)
RBC: 4.72 MIL/uL (ref 3.87–5.11)
RDW: 13.5 % (ref 11.5–15.5)
WBC: 11.9 10*3/uL — ABNORMAL HIGH (ref 4.0–10.5)

## 2010-11-27 LAB — URINE MICROSCOPIC-ADD ON

## 2010-11-27 LAB — DIFFERENTIAL
Basophils Absolute: 0 10*3/uL (ref 0.0–0.1)
Basophils Relative: 0 % (ref 0–1)
Eosinophils Relative: 0 % (ref 0–5)
Monocytes Absolute: 0.4 10*3/uL (ref 0.1–1.0)
Neutro Abs: 9.8 10*3/uL — ABNORMAL HIGH (ref 1.7–7.7)

## 2010-11-27 LAB — POCT PREGNANCY, URINE: Preg Test, Ur: NEGATIVE

## 2010-11-27 LAB — LIPASE, BLOOD: Lipase: 25 U/L (ref 11–59)

## 2011-04-08 ENCOUNTER — Emergency Department (HOSPITAL_COMMUNITY)
Admission: EM | Admit: 2011-04-08 | Discharge: 2011-04-08 | Disposition: A | Discharge status: Home / Self Care | Payer: Worker's Compensation | Attending: Emergency Medicine | Admitting: Emergency Medicine

## 2011-04-08 ENCOUNTER — Encounter (HOSPITAL_COMMUNITY): Payer: Self-pay | Admitting: Emergency Medicine

## 2011-04-08 ENCOUNTER — Emergency Department (HOSPITAL_COMMUNITY): Payer: Worker's Compensation

## 2011-04-08 DIAGNOSIS — X500XXA Overexertion from strenuous movement or load, initial encounter: Secondary | ICD-10-CM | POA: Insufficient documentation

## 2011-04-08 DIAGNOSIS — F17200 Nicotine dependence, unspecified, uncomplicated: Secondary | ICD-10-CM | POA: Insufficient documentation

## 2011-04-08 DIAGNOSIS — S93409A Sprain of unspecified ligament of unspecified ankle, initial encounter: Secondary | ICD-10-CM | POA: Insufficient documentation

## 2011-04-08 DIAGNOSIS — S93402A Sprain of unspecified ligament of left ankle, initial encounter: Secondary | ICD-10-CM

## 2011-04-08 MED ORDER — HYDROCODONE-ACETAMINOPHEN 5-325 MG PO TABS: 1.0000 | ORAL_TABLET | Freq: Four times a day (QID) | ORAL | Status: AC | PRN

## 2011-04-08 NOTE — ED Provider Notes (Signed)
Medical screening examination/treatment/procedure(s) were performed by non-physician practitioner and as supervising physician I was immediately available for consultation/collaboration.  Dilraj Killgore Smitty Cords, MD 04/08/11 0330

## 2011-04-08 NOTE — ED Notes (Signed)
Pt alert, nad, c/o left ankle pain, onset this evening, area mildly swollen, + pain, ambulates to triage

## 2011-04-08 NOTE — Discharge Instructions (Signed)
Ankle Sprain °An ankle sprain is an injury to the ligaments that hold the ankle joint together.  °CAUSES °The injury is usually caused by a fall or by twisting the ankle. It is important to tell your caregiver how the injury occurred and whether or not you were able to walk immediately after the injury.  °SYMPTOMS  °Pain is the primary symptom. It may be present at rest or only when you are trying to stand or walk. The ankle will likely be swollen. Bruising may develop immediately or after 1 or 2 days. It may be difficult or impossible to stand or walk. This depends on the severity of the sprain. °DIAGNOSIS  °Your caregiver can determine if a sprain has occurred based on the accident details and on examination of your ankle. Examination will include pressing and squeezing areas of the foot and ankle. Your caregiver will try to move the ankle in certain ways. X-rays may be used to be sure a bone was not broken, or that the ligament did not pull off of a bone (avulsion). There are standard guidelines that can reliably determine if an X-ray is needed. °TREATMENT  °Rest, ice, elevation, and compression are the basic modes of treatment. Certain types of braces can help stabilize the ankle and allow early return to walking. Your caregiver can make a recommendation for this. Medication may be recommended for pain. You may be referred to an orthopedist or a physical therapist for certain types of severe sprains. °HOME CARE INSTRUCTIONS  °· Apply ice to the sore area for 15 to 20 minutes, 3 to 4 times per day. Do this while you are awake for the first 2 days, or as directed. This can be stopped when the swelling goes away. Put the ice in a plastic bag and place a towel between the bag of ice and your skin.  °· Keep your leg elevated when possible to lessen swelling.  °· If your caregiver recommends crutches, use them as instructed with a non-weight bearing cast for 1 week. Then, you may walk on your ankle as the pain allows,  or as instructed. Gradually, put weight on the affected ankle. Continue to use crutches or a cane until you can walk without causing pain.  °· If a plaster splint was applied, wear the splint until you are seen for a follow-up examination. Rest it on nothing harder than a pillow the first 24 hours. Do not put weight on it. Do not get it wet. You may take it off to take a shower or bath.  °· You may have been given an elastic bandage to use with the plaster splint, or you may have been given a elastic bandage to use alone. The elastic bandage is too tight if you have numbness, tingling, or if your foot becomes cold and blue. Adjust the bandage to make it comfortable.  °· If an air splint was applied, you may blow more air into it or take some out to make it more comfortable. You may take it off at night and to take a shower or bath. Wiggle your toes in the splint several times per day if you are able.  °· Only take over-the-counter or prescription medicines for pain, discomfort, or fever as directed by your caregiver.  °· Do not drive a vehicle until your caregiver specifically tells you it is safe to do so.  °SEEK MEDICAL CARE IF:  °· You have an increase in bruising, swelling, or pain.  °· Your   toes feel cold.  °· Pain relief is not achieved with medications.  °SEEK IMMEDIATE MEDICAL CARE IF: °Your toes are numb or blue or you have severe pain. °MAKE SURE YOU:  °· Understand these instructions.  °· Will watch your condition.  °· Will get help right away if you are not doing well or get worse.  °Document Released: 02/08/2005 Document Revised: 05/15/2010 Document Reviewed: 09/13/2007 °[employer]® Patient Information ©2012 [employer]. °

## 2011-04-08 NOTE — ED Provider Notes (Signed)
History     CSN: [id number]  Arrival date & time 04/08/11  0049   First MD Initiated Contact with Patient 04/08/11 0113      HPI Patient reports she was standing up and did not realize her left foot was asleep. Reports she tripped and twisted her left ankle. Reports pain and swelling of the anterior and lateral ankle. Pain with ambulation. Patient is a 41 y.o. female presenting with ankle pain. The history is provided by the patient.  Ankle Pain  The incident occurred at work. The injury mechanism was a fall. The pain is present in the left ankle. The pain is moderate. The pain has been constant since onset. Pertinent negatives include no numbness. The symptoms are aggravated by activity, bearing weight and palpation. She has tried NSAIDs for the symptoms. The treatment provided mild relief.    History reviewed. No pertinent past medical history.  Past Surgical History  Procedure Date  . Abdominal hysterectomy     No family history on file.  History  Substance Use Topics  . Smoking status: Current Everyday Smoker -- 1.0 packs/day    Types: Cigarettes  . Smokeless tobacco: Not on file  . Alcohol Use: No    OB History    Grav Para Term Preterm Abortions TAB SAB Ect Mult Living                  Review of Systems  Musculoskeletal: Positive for joint swelling. Negative for back pain.       Ankle pain  Skin: Negative for color change and wound.  Neurological: Negative for numbness.  All other systems reviewed and are negative.    Allergies  Iohexol  Home Medications  No current outpatient prescriptions on file.  BP 110/61  Pulse 98  Temp 98 F (36.7 C)  Resp 16  Wt 140 lb (63.504 kg)  SpO2 99%  Physical Exam  Vitals reviewed. Constitutional: She is oriented to person, place, and time. Vital signs are normal. She appears well-developed and well-nourished. No distress.  HENT:  Head: Normocephalic and atraumatic.  Eyes: Pupils are equal, round, and reactive to  light.  Neck: Neck supple.  Pulmonary/Chest: Effort normal.  Musculoskeletal:       Left ankle: She exhibits swelling. She exhibits normal range of motion (decreased strength due to pain.), no ecchymosis and normal pulse. tenderness. Lateral malleolus and AITFL tenderness found. No medial malleolus, no CF ligament, no posterior TFL, no head of 5th metatarsal and no proximal fibula tenderness found. Achilles tendon normal.  Neurological: She is alert and oriented to person, place, and time.  Skin: Skin is warm and dry. No rash noted. No erythema. No pallor.  Psychiatric: She has a normal mood and affect. Her behavior is normal.    ED Course  Procedures (including critical care time)  Labs Reviewed - No data to display Dg Ankle Complete Left  04/08/2011  *RADIOLOGY REPORT*  Clinical Data: Lateral pain, bruising, and swelling after twisting injury.  LEFT ANKLE COMPLETE - 3+ VIEW  Comparison: None.  Findings: No evidence of acute fracture or subluxation.  No focal bone lesion or bone destruction.  Ankle mortis and talar dome appear intact.  Prominent posterior process of the talus.  Small accessory ossicle adjacent to the cuboidal.  Bone cortex and trabecular architecture appear intact.  No radiopaque foreign bodies.  IMPRESSION: No acute bony abnormalities suggested.  Original Report Authenticated By: Marlon Pel, M.D.     No diagnosis found.  MDM  Place patient in ASO and crutches. Advised followup with or without improvement in pain after 2 weeks.       Thomasene Lot, PA-C 04/08/11 [phone]

## 2011-09-23 ENCOUNTER — Emergency Department (HOSPITAL_BASED_OUTPATIENT_CLINIC_OR_DEPARTMENT_OTHER): Payer: Worker's Compensation

## 2011-09-23 ENCOUNTER — Emergency Department (HOSPITAL_BASED_OUTPATIENT_CLINIC_OR_DEPARTMENT_OTHER)
Admission: EM | Admit: 2011-09-23 | Discharge: 2011-09-23 | Disposition: A | Discharge status: Home / Self Care | Payer: Worker's Compensation | Attending: Emergency Medicine | Admitting: Emergency Medicine

## 2011-09-23 ENCOUNTER — Encounter (HOSPITAL_BASED_OUTPATIENT_CLINIC_OR_DEPARTMENT_OTHER): Payer: Self-pay | Admitting: Emergency Medicine

## 2011-09-23 DIAGNOSIS — M25469 Effusion, unspecified knee: Secondary | ICD-10-CM | POA: Insufficient documentation

## 2011-09-23 DIAGNOSIS — M542 Cervicalgia: Secondary | ICD-10-CM | POA: Diagnosis not present

## 2011-09-23 DIAGNOSIS — R079 Chest pain, unspecified: Secondary | ICD-10-CM | POA: Insufficient documentation

## 2011-09-23 DIAGNOSIS — R51 Headache: Secondary | ICD-10-CM | POA: Insufficient documentation

## 2011-09-23 DIAGNOSIS — M545 Low back pain, unspecified: Secondary | ICD-10-CM | POA: Insufficient documentation

## 2011-09-23 DIAGNOSIS — M25462 Effusion, left knee: Secondary | ICD-10-CM

## 2011-09-23 DIAGNOSIS — T1490XA Injury, unspecified, initial encounter: Secondary | ICD-10-CM | POA: Insufficient documentation

## 2011-09-23 MED ORDER — ONDANSETRON 8 MG PO TBDP
8.0000 mg | ORAL_TABLET | Freq: Once | ORAL | Status: AC
  Administered 2011-09-23: 8 mg via ORAL

## 2011-09-23 MED ORDER — OXYCODONE-ACETAMINOPHEN 5-325 MG PO TABS
1.0000 | ORAL_TABLET | Freq: Once | ORAL | Status: AC
  Administered 2011-09-23: 1 via ORAL
  Filled 2011-09-23 (×2): qty 1

## 2011-09-23 MED ORDER — ONDANSETRON 8 MG PO TBDP
ORAL_TABLET | ORAL | Status: AC
  Filled 2011-09-23: qty 1

## 2011-09-23 MED ORDER — HYDROCODONE-ACETAMINOPHEN 5-500 MG PO TABS: 1.0000 | ORAL_TABLET | Freq: Four times a day (QID) | ORAL | Status: AC | PRN
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
## 2011-09-23 NOTE — ED Notes (Signed)
Pt requested anti-nausea medication with percocet. MD made aware, order received and initiated.

## 2012-10-23 ENCOUNTER — Emergency Department (HOSPITAL_COMMUNITY)
Admission: EM | Admit: 2012-10-23 | Discharge: 2012-10-24 | Disposition: A | Discharge status: Home / Self Care | Payer: Self-pay | Attending: Emergency Medicine | Admitting: Emergency Medicine

## 2012-10-23 ENCOUNTER — Emergency Department (HOSPITAL_COMMUNITY): Payer: Self-pay

## 2012-10-23 ENCOUNTER — Encounter (HOSPITAL_COMMUNITY): Payer: Self-pay | Admitting: *Deleted

## 2012-10-23 DIAGNOSIS — S2249XA Multiple fractures of ribs, unspecified side, initial encounter for closed fracture: Secondary | ICD-10-CM | POA: Insufficient documentation

## 2012-10-23 DIAGNOSIS — Y92009 Unspecified place in unspecified non-institutional (private) residence as the place of occurrence of the external cause: Secondary | ICD-10-CM | POA: Insufficient documentation

## 2012-10-23 DIAGNOSIS — F17200 Nicotine dependence, unspecified, uncomplicated: Secondary | ICD-10-CM | POA: Insufficient documentation

## 2012-10-23 DIAGNOSIS — W010XXA Fall on same level from slipping, tripping and stumbling without subsequent striking against object, initial encounter: Secondary | ICD-10-CM | POA: Insufficient documentation

## 2012-10-23 DIAGNOSIS — Y939 Activity, unspecified: Secondary | ICD-10-CM | POA: Insufficient documentation

## 2012-10-23 DIAGNOSIS — Z888 Allergy status to other drugs, medicaments and biological substances status: Secondary | ICD-10-CM | POA: Insufficient documentation

## 2012-10-23 DIAGNOSIS — S2232XA Fracture of one rib, left side, initial encounter for closed fracture: Secondary | ICD-10-CM

## 2012-10-23 MED ORDER — OXYCODONE-ACETAMINOPHEN 5-325 MG PO TABS
1.0000 | ORAL_TABLET | Freq: Once | ORAL | Status: AC
  Administered 2012-10-23: 1 via ORAL
  Filled 2012-10-23: qty 1

## 2012-10-23 MED ORDER — OXYCODONE-ACETAMINOPHEN 5-325 MG PO TABS: ORAL_TABLET | ORAL | Status: DC

## 2012-10-23 NOTE — ED Notes (Signed)
Pt presents with left rib pain- states that she fell a week ago and hurt that area of her body.  Today while getting ready for work she bent over and heard a "pop"- ever since pt admits to difficulty taking a deep breath and pain with movement.  Lung sound clear throughout- 100% on RA.

## 2012-10-23 NOTE — ED Provider Notes (Signed)
CSN: [id number]     Arrival date & time 10/23/12  2207 History   First MD Initiated Contact with Patient 10/23/12 2224     Chief Complaint  Patient presents with  . Fall   (Consider location/radiation/quality/duration/timing/severity/associated sxs/prior Treatment) HPI  [name] is a 42 y.o. female complaining of left-sided pleuritic chest pain worsening over the last 5 days when patient fell and hit her left ribs on the bathtub. Patient denies shortness of breath but states that the pain is exacerbated when she takes a deep breath. She denies head trauma, LOC, headache, cervicalgia, fever, cough.  History reviewed. No pertinent past medical history. Past Surgical History  Procedure Laterality Date  . Abdominal hysterectomy     History reviewed. No pertinent family history. History  Substance Use Topics  . Smoking status: Current Every Day Smoker -- 1.00 packs/day    Types: Cigarettes  . Smokeless tobacco: Not on file  . Alcohol Use: No   OB History   Grav Para Term Preterm Abortions TAB SAB Ect Mult Living                 Review of Systems 10 systems reviewed and found to be negative, except as noted in the HPI  Allergies  Iohexol  Home Medications   Current Outpatient Rx  Name  Route  Sig  Dispense  Refill  . ibuprofen (ADVIL,MOTRIN) 200 MG tablet   Oral   Take 200 mg by mouth every 6 (six) hours as needed for pain.          BP 128/80  Pulse 84  Temp(Src) 98.3 F (36.8 C) (Oral)  Resp 17  SpO2 98% Physical Exam  Nursing note and vitals reviewed. Constitutional: She is oriented to person, place, and time. She appears well-developed and well-nourished. No distress.  HENT:  Head: Normocephalic.  Mouth/Throat: Oropharynx is clear and moist.  Eyes: Conjunctivae and EOM are normal. Pupils are equal, round, and reactive to light.  Neck: Normal range of motion. Neck supple.  No midline tenderness to palpation or step-offs appreciated. Patient has  full range of motion without pain.   Cardiovascular: Normal rate.   Pulmonary/Chest: Effort normal and breath sounds normal. No stridor. No respiratory distress. She has no wheezes. She has no rales. She exhibits no tenderness.  Good air movement in all fields  Ecchymoses or crepitance, patient is exquisitely tender to palpation in the left lower anterior and mid axillary lines.  Abdominal: Soft. Bowel sounds are normal. She exhibits no distension and no mass. There is no tenderness. There is no rebound and no guarding.  Musculoskeletal: Normal range of motion.  Neurological: She is alert and oriented to person, place, and time.  Psychiatric: She has a normal mood and affect.    ED Course  Procedures (including critical care time) Labs Review Labs Reviewed - No data to display Imaging Review Dg Ribs Unilateral W/chest Left  10/23/2012   *RADIOLOGY REPORT*  Clinical Data: Patient fell in bathtub  LEFT RIBS AND CHEST - 3+ VIEW  Comparison: Chest radiograph 09/23/2011  Findings: Normal heart, mediastinal, hilar contours.  Midline trachea.  Lungs well expanded and clear.  Bilateral nipple shadows noted.  Negative for pleural effusion or pneumothorax.  There is subtle irregularity of the anterior aspects of the left seventh, ninth, and possibly eighth ribs near the costochondral junctions, suspicious for fractures.  IMPRESSION:  1.  Subtle irregularities of the anterior left seventh, ninth, and probably eighth ribs suspicious for acute  fractures near the costochondral junctions. 2.  No acute cardiopulmonary disease.   Original Report Authenticated By: Britta Mccreedy, M.D.    MDM   1. Rib fracture, left, closed, initial encounter     Filed Vitals:   10/23/12 2212 10/23/12 2257 10/23/12 2358  BP: 133/83 128/80 110/78  Pulse: 100 84 80  Temp: 98.3 F (36.8 C)    TempSrc: Oral    Resp:  17   SpO2: 100% 98% 97%     [name] is a 42 y.o. female of left-sided rib pain status post  slip and fall 5 days ago. Chest x-ray shows subtle irregularities in the left seventh and ninth and eighth ribs. Fractures are nondisplaced and face are indeed true fracture. There is no pneumothorax. Advised patient that is important to aggressively control pain, advised her to take 5 deep breaths every hour to prevent atelectasis and pneumonia.  Medications  oxyCODONE-acetaminophen (PERCOCET/ROXICET) 5-325 MG per tablet 1 tablet (1 tablet Oral Given 10/23/12 2358)    Pt is hemodynamically stable, appropriate for, and amenable to discharge at this time. Pt verbalized understanding and agrees with care plan. All questions answered. Outpatient follow-up and specific return precautions discussed.    Discharge Medication List as of 10/23/2012 11:28 PM    START taking these medications   Details  oxyCODONE-acetaminophen (PERCOCET/ROXICET) 5-325 MG per tablet 1 to 2 tabs PO q6hrs  PRN for pain, Print        Note: Portions of this report may have been transcribed using voice recognition software. Every effort was made to ensure accuracy; however, inadvertent computerized transcription errors may be present      Wynetta Emery, PA-C 10/26/12 [phone]

## 2012-10-23 NOTE — ED Notes (Signed)
Pt states that she tripped and fell. Pt landed on her left side of her ribs and is complaining of rib pain. Pt denies LOC.

## 2012-10-26 NOTE — ED Provider Notes (Signed)
Medical screening examination/treatment/procedure(s) were performed by non-physician practitioner and as supervising physician I was immediately available for consultation/collaboration.  Langley Flatley K Doyce Saling-Rasch, MD 10/26/12 2345 

## 2015-09-08 ENCOUNTER — Emergency Department (HOSPITAL_COMMUNITY)
Admission: EM | Admit: 2015-09-08 | Discharge: 2015-09-08 | Disposition: A | Discharge status: Home / Self Care | Payer: Worker's Compensation | Attending: Emergency Medicine | Admitting: Emergency Medicine

## 2015-09-08 ENCOUNTER — Encounter (HOSPITAL_COMMUNITY): Payer: Self-pay

## 2015-09-08 ENCOUNTER — Other Ambulatory Visit (HOSPITAL_COMMUNITY): Payer: Self-pay | Admitting: *Deleted

## 2015-09-08 DIAGNOSIS — L539 Erythematous condition, unspecified: Secondary | ICD-10-CM

## 2015-09-08 DIAGNOSIS — Z791 Long term (current) use of non-steroidal anti-inflammatories (NSAID): Secondary | ICD-10-CM | POA: Insufficient documentation

## 2015-09-08 DIAGNOSIS — N644 Mastodynia: Secondary | ICD-10-CM

## 2015-09-08 DIAGNOSIS — F17210 Nicotine dependence, cigarettes, uncomplicated: Secondary | ICD-10-CM | POA: Insufficient documentation

## 2015-09-08 DIAGNOSIS — N6459 Other signs and symptoms in breast: Secondary | ICD-10-CM | POA: Insufficient documentation

## 2015-09-08 MED ORDER — MUPIROCIN CALCIUM 2 % EX CREA: 1.0000 "application " | TOPICAL_CREAM | Freq: Two times a day (BID) | CUTANEOUS | Status: AC

## 2015-09-08 NOTE — Discharge Instructions (Signed)
Please call the Breast Center listed to schedule an appointment as soon as possible. Use ointment twice daily until you see the Breast Center. Return to ER for new or worsening symptoms, any additional concerns.

## 2015-09-08 NOTE — ED Notes (Signed)
Pain, redness drainage in left side nipple breast.  Started 3 weeks ago.  No physical exams or mammograms completed in past.  Slight fever and diarrhea since Thursday.

## 2015-09-08 NOTE — ED Provider Notes (Signed)
CSN: [id number]     Arrival date & time 09/08/15  1153 History   First MD Initiated Contact with Patient 09/08/15 1208     Chief Complaint  Patient presents with  . Breast Pain    (Consider location/radiation/quality/duration/timing/severity/associated sxs/prior Treatment) The history is provided by the patient and medical records. No language interpreter was used.     [name] is a 45 y.o. female  who presents to the Emergency Department complaining of worsening pain and red, dry skin around her left nipple x 3 weeks. Creamy discharge 2 days. She states she feels like her nipple "looks different" than it did three weeks ago. Admits to associated subjective low grade fever. No medications or treatments taken prior to arrival for symptoms. Pain worse with palpation. No alleviating factors noted. No family history of breast cancer.    History reviewed. No pertinent past medical history. Past Surgical History  Procedure Laterality Date  . Abdominal hysterectomy     History reviewed. No pertinent family history. Social History  Substance Use Topics  . Smoking status: Current Every Day Smoker -- 1.00 packs/day    Types: Cigarettes  . Smokeless tobacco: None  . Alcohol Use: No   OB History    No data available     Review of Systems  Constitutional: Positive for fever (Subjective, low grade per patient). Negative for chills.  HENT: Negative for congestion.   Eyes: Negative for visual disturbance.  Respiratory: Negative for cough and shortness of breath.   Cardiovascular: Negative.   Gastrointestinal: Negative for nausea, vomiting and abdominal pain.  Genitourinary: Negative for dysuria.  Musculoskeletal: Negative for neck pain.  Skin: Positive for color change (Left nipple erythema).  Neurological: Negative for headaches.      Allergies  Iohexol and Sulfa antibiotics  Home Medications   Prior to Admission medications   Medication Sig Start Date End Date  Taking? Authorizing Provider  ibuprofen (ADVIL,MOTRIN) 200 MG tablet Take 600 mg by mouth every 6 (six) hours as needed for fever, headache, mild pain, moderate pain or cramping.    Yes Historical Provider, MD  naproxen sodium (ANAPROX) 220 MG tablet Take 220 mg by mouth every 12 (twelve) hours as needed (for pain).   Yes Historical Provider, MD  mupirocin cream (BACTROBAN) 2 % Apply 1 application topically 2 (two) times daily. 09/08/15   Trafton Roker Pilcher Sophie Quiles, PA-C   BP 115/80 mmHg  Pulse 80  Temp(Src) 97.8 F (36.6 C) (Oral)  Resp 16  SpO2 99% Physical Exam  Constitutional: She is oriented to person, place, and time. She appears well-developed and well-nourished. No distress.  HENT:  Head: Normocephalic and atraumatic.  Cardiovascular: Normal rate, regular rhythm, normal heart sounds and intact distal pulses.  Exam reveals no gallop and no friction rub.   No murmur heard. Pulmonary/Chest: Effort normal and breath sounds normal. No respiratory distress. She has no wheezes. She has no rales. She exhibits no tenderness. Left breast exhibits tenderness.  Left breast: no masses appreciated. 1.5 cm area of erythema surrounding nipple which is tender and skin is dry and flaky. No warmth appreciated. No discharge expressed on exam, however patient admits to discharge from area this morning.   Abdominal: Soft. Bowel sounds are normal. She exhibits no distension. There is no tenderness.  Musculoskeletal: She exhibits no edema.  Neurological: She is alert and oriented to person, place, and time.  Skin: Skin is warm and dry. There is erythema.  Nursing note and vitals reviewed.  ED Course  Procedures (including critical care time) Labs Review Labs Reviewed - No data to display  Imaging Review No results found. I have personally reviewed and evaluated these images and lab results as part of my medical decision-making.   EKG Interpretation None      MDM   Final diagnoses:  Erythema of  breast   [name] presents to ED for left erythema around left nipple. On exam, patient is well appearing, afebrile and hemodynamically stable. There is erythema surrounding left nipple which does not look infectious. No signs of underlying abscess and does not appear cellulitic. Discussed with patient that she needs to see breast center for mammogram or ultrasound as soon as possible to rule out malignancy. Sister-in-law at bedside is former breast cx survivor and states that she will make sure patient follows up with breast center. Will give abx ointment for area, but stressed the importance of follow up with breast center. Return precautions given and all questions answered.     [facility] Vennie Waymire, PA-C 09/08/15 1350  Benjiman Core, MD 09/09/15 1731

## 2015-09-25 ENCOUNTER — Ambulatory Visit
Admission: RE | Admit: 2015-09-25 | Discharge: 2015-09-25 | Disposition: A | Discharge status: Home / Self Care | Payer: Worker's Compensation | Source: Ambulatory Visit | Attending: Obstetrics and Gynecology | Admitting: Obstetrics and Gynecology

## 2015-09-25 ENCOUNTER — Other Ambulatory Visit (HOSPITAL_COMMUNITY): Payer: Self-pay | Admitting: Obstetrics and Gynecology

## 2015-09-25 ENCOUNTER — Ambulatory Visit (HOSPITAL_COMMUNITY)
Admission: RE | Admit: 2015-09-25 | Discharge: 2015-09-25 | Disposition: A | Discharge status: Home / Self Care | Payer: Worker's Compensation | Source: Ambulatory Visit | Attending: Obstetrics and Gynecology | Admitting: Obstetrics and Gynecology

## 2015-09-25 ENCOUNTER — Encounter (HOSPITAL_COMMUNITY): Payer: Self-pay

## 2015-09-25 VITALS — BP 102/68 | Temp 97.9°F | Ht 70.5 in | Wt 185.4 lb

## 2015-09-25 DIAGNOSIS — N644 Mastodynia: Secondary | ICD-10-CM

## 2015-09-25 DIAGNOSIS — Z1239 Encounter for other screening for malignant neoplasm of breast: Secondary | ICD-10-CM

## 2015-09-25 NOTE — Progress Notes (Signed)
Complaints of left nipple swollen, itchy, red, and painful. Patient states it looks like there is a rash. Patient complained of yellowishPatient states the pain comes and goes rating it at a 5 out of 10.  Pap Smear: Pap smear not completed today. Last Pap smear was 10 years ago and normal per patient. Per patient has no history of an abnormal Pap smear. Patient has a history of a hysterectomy when she was 45 years old due to endometriosis and a cyst. Patient no longer needs Pap smears due to her history of a hysterectomy for benign reasons per BCCCP and ACOG guidelines.  Physical exam: Breasts Breasts symmetrical. No skin abnormalities right breast. Right nipple is swollen, red, and dry. No nipple retraction bilateral breasts. No nipple discharge bilateral breasts. Unable to express discharge from the left breast on exam. No lymphadenopathy. No lumps palpated bilateral breasts. Complaints of tenderness when palpated left areola. Referred patient to the [facility] for diagnostic mammogram and possible left breast ultrasound. Appointment scheduled for Thursday, September 25, 2015 at 1130.        Pelvic/Bimanual No Pap smear completed today since patient has a history of a hysterectomy for benign reasons. Pap smear not indicated per BCCCP guidelines.   Smoking History: Patient has never smoked.  Patient Navigation: Patient education provided. Access to services provided for patient through [facility] program.

## 2015-09-25 NOTE — Patient Instructions (Signed)
Explained breast self awareness to [employer]. Informed patient she no longer needs Pap smears due to her history of a hysterectomy for benign reasons. Referred patient to the [facility] for diagnostic mammogram and possible left breast ultrasound. Appointment scheduled for Thursday, September 25, 2015 at 1130. [name] verbalized understanding.  Enzo Treu, Kathaleen Maser, RN 2:25 PM

## 2015-09-29 ENCOUNTER — Encounter (HOSPITAL_COMMUNITY): Payer: Self-pay | Admitting: *Deleted

## 2015-10-09 ENCOUNTER — Ambulatory Visit
Admission: RE | Admit: 2015-10-09 | Discharge: 2015-10-09 | Disposition: A | Discharge status: Home / Self Care | Payer: No Typology Code available for payment source | Source: Ambulatory Visit | Attending: Obstetrics and Gynecology | Admitting: Obstetrics and Gynecology

## 2015-10-09 DIAGNOSIS — N644 Mastodynia: Secondary | ICD-10-CM

## 2015-11-26 ENCOUNTER — Telehealth (HOSPITAL_COMMUNITY): Payer: Self-pay | Admitting: *Deleted

## 2015-11-26 NOTE — Telephone Encounter (Signed)
Attempted to call patient to discuss to follow up with her on BCCCP recommendations. No one answered phone. Left voicemail for patient to call me back.

## 2015-12-16 ENCOUNTER — Emergency Department (HOSPITAL_COMMUNITY): Payer: No Typology Code available for payment source

## 2015-12-16 ENCOUNTER — Encounter (HOSPITAL_COMMUNITY): Payer: Self-pay | Admitting: Emergency Medicine

## 2015-12-16 ENCOUNTER — Emergency Department (HOSPITAL_COMMUNITY)
Admission: EM | Admit: 2015-12-16 | Discharge: 2015-12-16 | Disposition: A | Discharge status: Home / Self Care | Payer: No Typology Code available for payment source | Attending: Emergency Medicine | Admitting: Emergency Medicine

## 2015-12-16 DIAGNOSIS — Y939 Activity, unspecified: Secondary | ICD-10-CM | POA: Insufficient documentation

## 2015-12-16 DIAGNOSIS — N9489 Other specified conditions associated with female genital organs and menstrual cycle: Secondary | ICD-10-CM

## 2015-12-16 DIAGNOSIS — W01190A Fall on same level from slipping, tripping and stumbling with subsequent striking against furniture, initial encounter: Secondary | ICD-10-CM | POA: Insufficient documentation

## 2015-12-16 DIAGNOSIS — Y929 Unspecified place or not applicable: Secondary | ICD-10-CM | POA: Insufficient documentation

## 2015-12-16 DIAGNOSIS — S300XXA Contusion of lower back and pelvis, initial encounter: Secondary | ICD-10-CM | POA: Insufficient documentation

## 2015-12-16 DIAGNOSIS — Y999 Unspecified external cause status: Secondary | ICD-10-CM | POA: Insufficient documentation

## 2015-12-16 DIAGNOSIS — W19XXXA Unspecified fall, initial encounter: Secondary | ICD-10-CM

## 2015-12-16 DIAGNOSIS — F17210 Nicotine dependence, cigarettes, uncomplicated: Secondary | ICD-10-CM | POA: Insufficient documentation

## 2015-12-16 MED ORDER — HYDROCODONE-ACETAMINOPHEN 5-325 MG PO TABS: 2.0000 | ORAL_TABLET | ORAL | 0 refills | Status: DC | PRN

## 2015-12-16 MED ORDER — HYDROCODONE-ACETAMINOPHEN 5-325 MG PO TABS
1.0000 | ORAL_TABLET | Freq: Once | ORAL | Status: AC
  Administered 2015-12-16: 1 via ORAL
  Filled 2015-12-16: qty 1

## 2015-12-16 NOTE — ED Notes (Signed)
Patient transported to CT 

## 2015-12-16 NOTE — ED Triage Notes (Signed)
Pt sts fall this weekend with pain in right rib area and vaginal area with bruising

## 2015-12-16 NOTE — ED Notes (Signed)
PA Dowless at bedside  

## 2015-12-16 NOTE — Discharge Instructions (Signed)
Apply ice to affected area. Take pain medication as needed. Follow up with primary care provider.

## 2015-12-16 NOTE — ED Provider Notes (Signed)
[facility]-EMERGENCY DEPT Provider Note   CSN: [id number] Arrival date & time: 12/16/15  1001     History   Chief Complaint Chief Complaint  Patient presents with  . Vaginal Pain  . Fall    HPI [name] is a 45 y.o. female with No significant past medical history presents the ED today to be evaluated after a fall. Patient states that 3 days ago she woke up from sleep on the couch and tripped over the rug causing her to fall onto her fireplace where she struck the right side of her ribs. Patient states she then bounced off and on fell forward landing onto her coffee table. She states that she struck the anterior aspect of her vagina onto the corner of the coffee table. Patient has significant bruising in this area that she states is getting worse and is now traveling down her leg. Patient is concerned for possible blood clot. Patient is able to ambulate but states is painful. She has tried taking Motrin for pain without relief of her symptoms. She denies any head injury or loss of consciousness. Patient is not on blood thinners.  HPI  History reviewed. No pertinent past medical history.  There are no active problems to display for this patient.   Past Surgical History:  Procedure Laterality Date  . ABDOMINAL HYSTERECTOMY    . TUBAL LIGATION      OB History    Gravida Para Term Preterm AB Living   2 2 2     2    SAB TAB Ectopic Multiple Live Births                   Home Medications    Prior to Admission medications   Medication Sig Start Date End Date Taking? Authorizing Provider  ibuprofen (ADVIL,MOTRIN) 200 MG tablet Take 600 mg by mouth every 6 (six) hours as needed for fever, headache, mild pain, moderate pain or cramping.     Historical Provider, MD  mupirocin cream (BACTROBAN) 2 % Apply 1 application topically 2 (two) times daily. Patient not taking: Reported on 09/25/2015 09/08/15   [facility] Ward, PA-C  naproxen sodium (ANAPROX) 220 MG tablet Take 220 mg by  mouth every 12 (twelve) hours as needed (for pain).    Historical Provider, MD    Family History Family History  Problem Relation Age of Onset  . Hypertension Maternal Grandmother   . Stroke Maternal Grandmother     Social History Social History  Substance Use Topics  . Smoking status: Current Every Day Smoker    Packs/day: 1.00    Types: Cigarettes  . Smokeless tobacco: Current User  . Alcohol use Yes     Comment: socially     Allergies   Iohexol and Sulfa antibiotics   Review of Systems Review of Systems  All other systems reviewed and are negative.    Physical Exam Updated Vital Signs BP 107/83   Pulse 72   Temp 98 F (36.7 C) (Oral)   Resp 16   SpO2 96%   Physical Exam  Constitutional: She is oriented to person, place, and time. She appears well-developed and well-nourished. No distress.  HENT:  Head: Normocephalic and atraumatic.  Mouth/Throat: No oropharyngeal exudate.  Eyes: Conjunctivae and EOM are normal. Pupils are equal, round, and reactive to light. Right eye exhibits no discharge. Left eye exhibits no discharge. No scleral icterus.  Cardiovascular: Normal rate, regular rhythm, normal heart sounds and intact distal pulses.  Exam reveals  no gallop and no friction rub.   No murmur heard. Pulmonary/Chest: Effort normal and breath sounds normal. No respiratory distress. She has no wheezes. She has no rales. She exhibits tenderness ( TTP over 5th and 6th ribs in MCL. ).  Chest expansion equal b/l with inspiration. No ecchymosis  Abdominal: Soft. She exhibits no distension. There is no tenderness. There is no guarding.  Genitourinary:  Genitourinary Comments: Significant ecchymosis over mons pubis, left labia majora and left inner thigh.   Musculoskeletal: Normal range of motion. She exhibits no edema.  Neurological: She is alert and oriented to person, place, and time.  Skin: Skin is warm and dry. No rash noted. She is not diaphoretic. No erythema. No  pallor.  Psychiatric: She has a normal mood and affect. Her behavior is normal.  Nursing note and vitals reviewed.    ED Treatments / Results  Labs (all labs ordered are listed, but only abnormal results are displayed) Labs Reviewed - No data to display  EKG  EKG Interpretation None       Radiology Dg Ribs Unilateral W/chest Right  Result Date: 12/16/2015 CLINICAL DATA:  Status post fall.  Right anterior rib pain EXAM: RIGHT RIBS AND CHEST - 3+ VIEW COMPARISON:  10/23/2012 FINDINGS: No fracture or other bone lesions are seen involving the ribs. There is no evidence of pneumothorax or pleural effusion. Both lungs are clear. Heart size and mediastinal contours are within normal limits. IMPRESSION: Negative. Electronically Signed   By: Elige KoHetal  Patel   On: 12/16/2015 11:02   Ct Pelvis Wo Contrast  Result Date: 12/16/2015 CLINICAL DATA:  Status post fall this weekend. Bruising on the anterior pelvis. EXAM: CT PELVIS WITHOUT CONTRAST TECHNIQUE: Multidetector CT imaging of the pelvis was performed following the standard protocol without intravenous contrast. COMPARISON:  None. FINDINGS: Bones/Joint/Cartilage No fracture or dislocation. Normal alignment. No joint effusion. Degenerative disc disease with mild disc height loss with a mild broad based disc bulge. Ligaments Ligaments are suboptimally evaluated by CT. Muscles and Tendons Muscles are normal without atrophy. Soft tissue No fluid collection or hematoma. No soft tissue mass. Bruising in the left prepubic soft tissues with a small hematoma. IMPRESSION: 1.  No acute osseous injury of the pelvis. 2. Bruising in the left prepubic soft tissues with a small hematoma. Electronically Signed   By: Elige KoHetal  Patel   On: 12/16/2015 12:17    Procedures Procedures (including critical care time)  Medications Ordered in ED Medications - No data to display   Initial Impression / Assessment and Plan / ED Course  I have reviewed the triage vital  signs and the nursing notes.  Pertinent labs & imaging results that were available during my care of the patient were reviewed by me and considered in my medical decision making (see chart for details).  Clinical Course    Otherwise healthy 45 y.o F presents to the ED today to be evaluated after a mechanical fall that occurred 3 days ago. No head injury/LOC. TTP over right ribs. Normal breathing. CXR negative for rib fracture/pneumothroax. Pt also has bruising over mons pubis, left labia and left inner thigh. NO vaginal bleeding or abdominal pain. Pt denies that this injury was due to assault of any kind. CT pelvis obtained which was negative for fracture. Small hematoma present in prepubic soft tissues. Pain managed in ED. Will d/c to home with pain medication. Recommend applying ice to affected area. Return precautions outlined in patient discharge instructions.     Final  Clinical Impressions(s) / ED Diagnoses   Final diagnoses:  Fall, initial encounter  Pelvic hematoma, female    New Prescriptions New Prescriptions   No medications on file     Dub Mikes, PA-C 12/18/15 0845    Eber Hong, MD 12/19/15 1047

## 2015-12-29 ENCOUNTER — Telehealth (HOSPITAL_COMMUNITY): Payer: Self-pay | Admitting: *Deleted

## 2015-12-29 NOTE — Telephone Encounter (Signed)
Attempted to call patient to see if has followed up with Dermatologist as recommended. No one answered phone. Left voicemail for patient to call me back.

## 2018-06-22 IMAGING — DX DG RIBS W/ CHEST 3+V*R*
4 series · 4 of 4 positions shown · non-contrast
Comparison: 10/23/2012

CLINICAL DATA: Status post fall.  Right anterior rib pain

EXAM:
RIGHT RIBS AND CHEST - 3+ VIEW

[w chest pa]
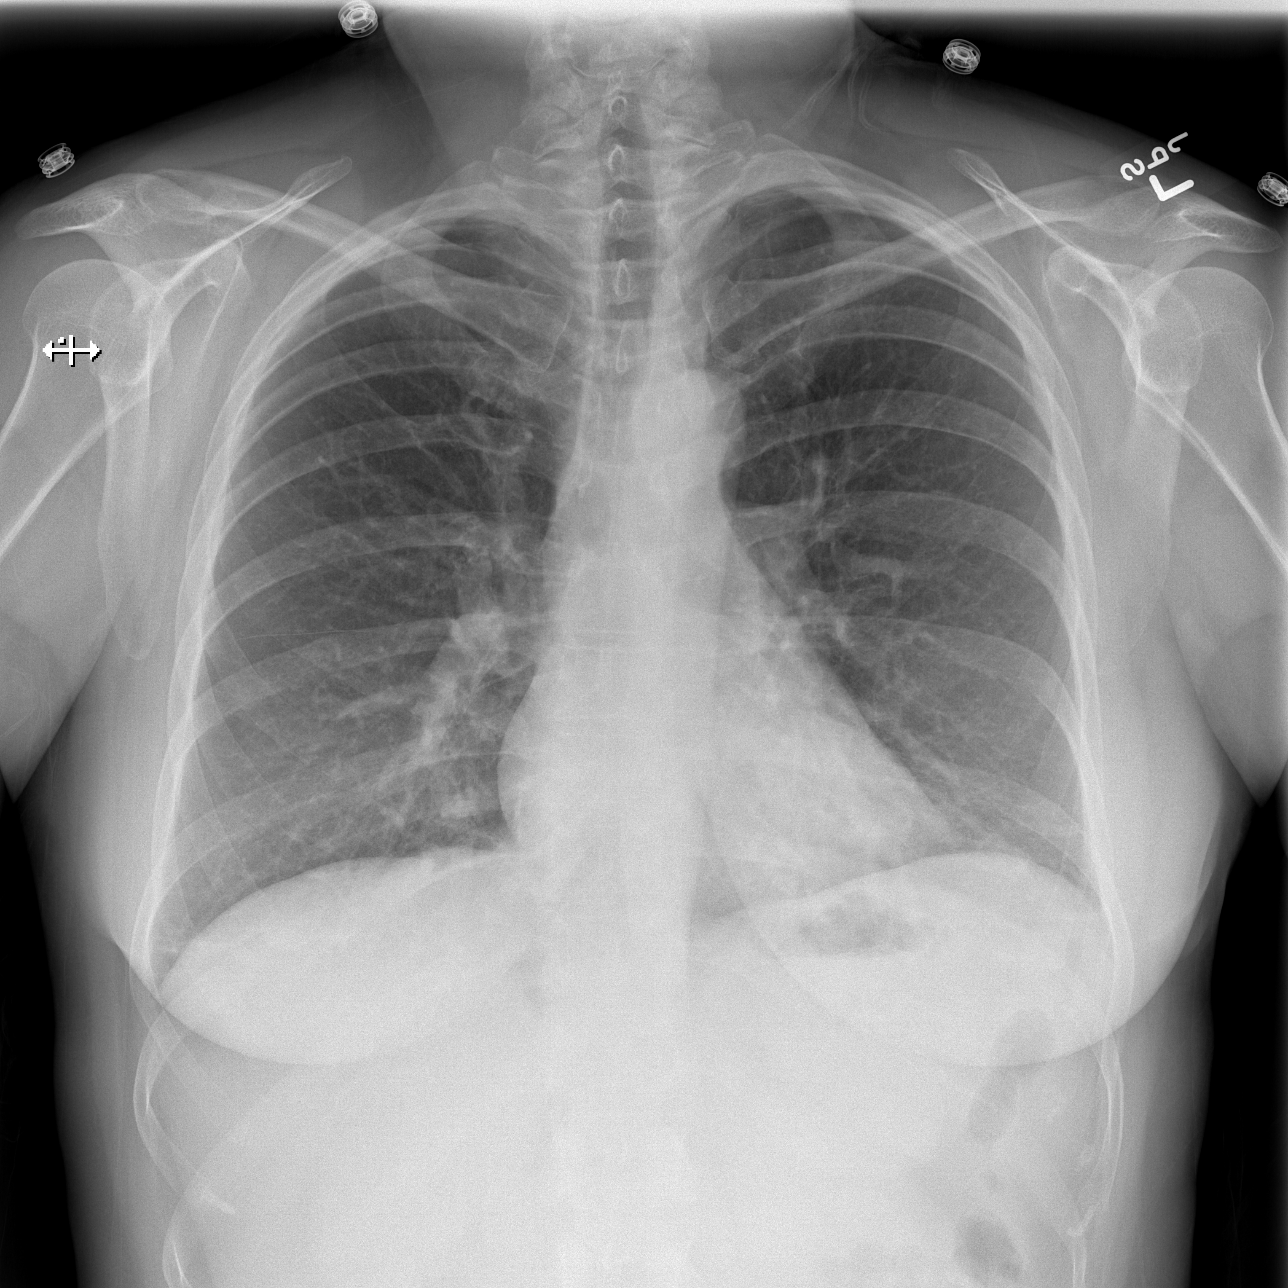

[w ribs ap upper right (1 of 2)]
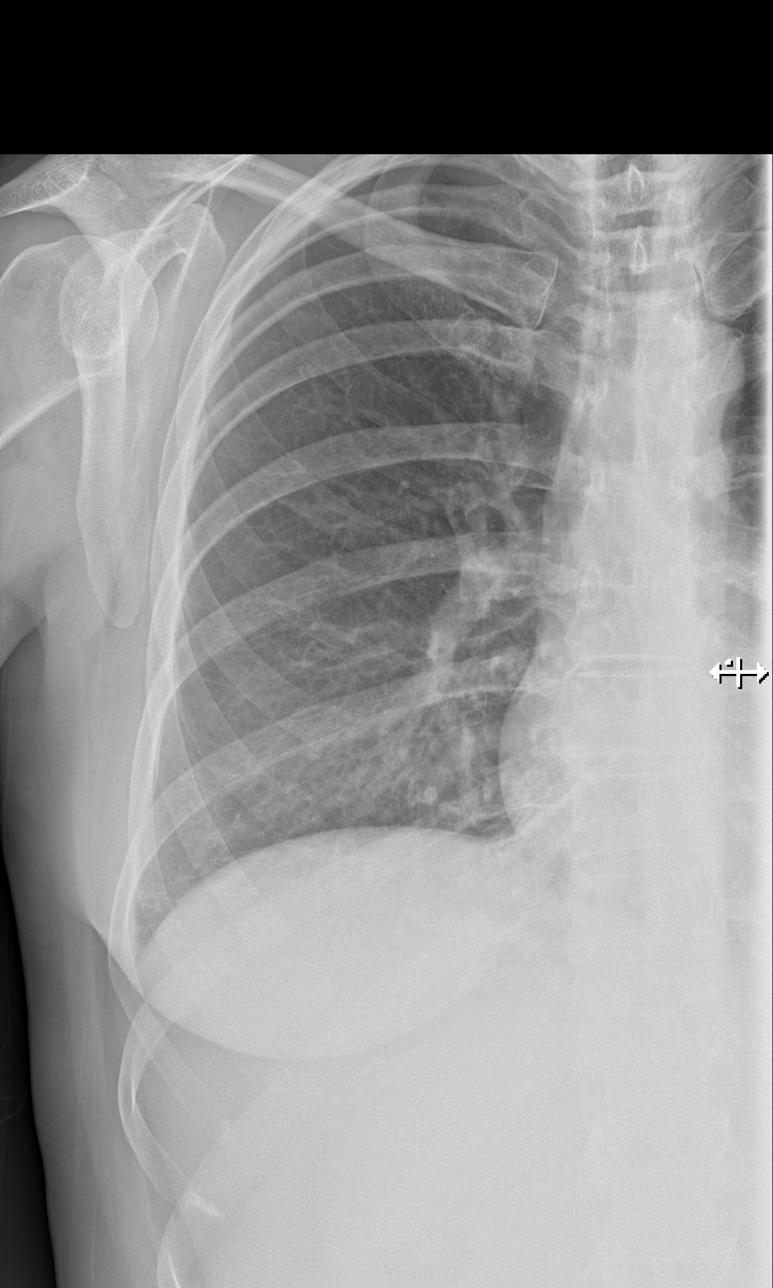

[w ribs ap upper right (2 of 2)]
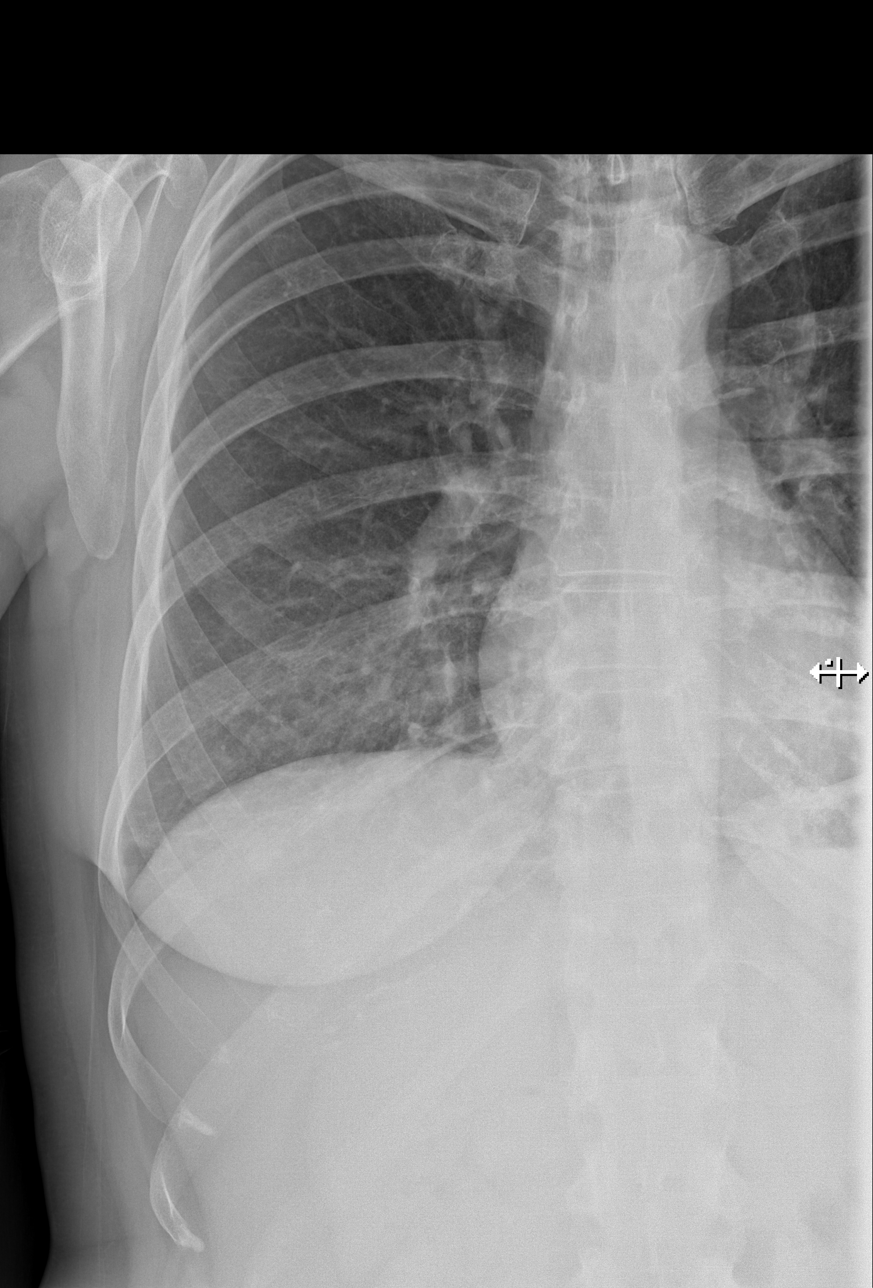

[w ribs obl right]
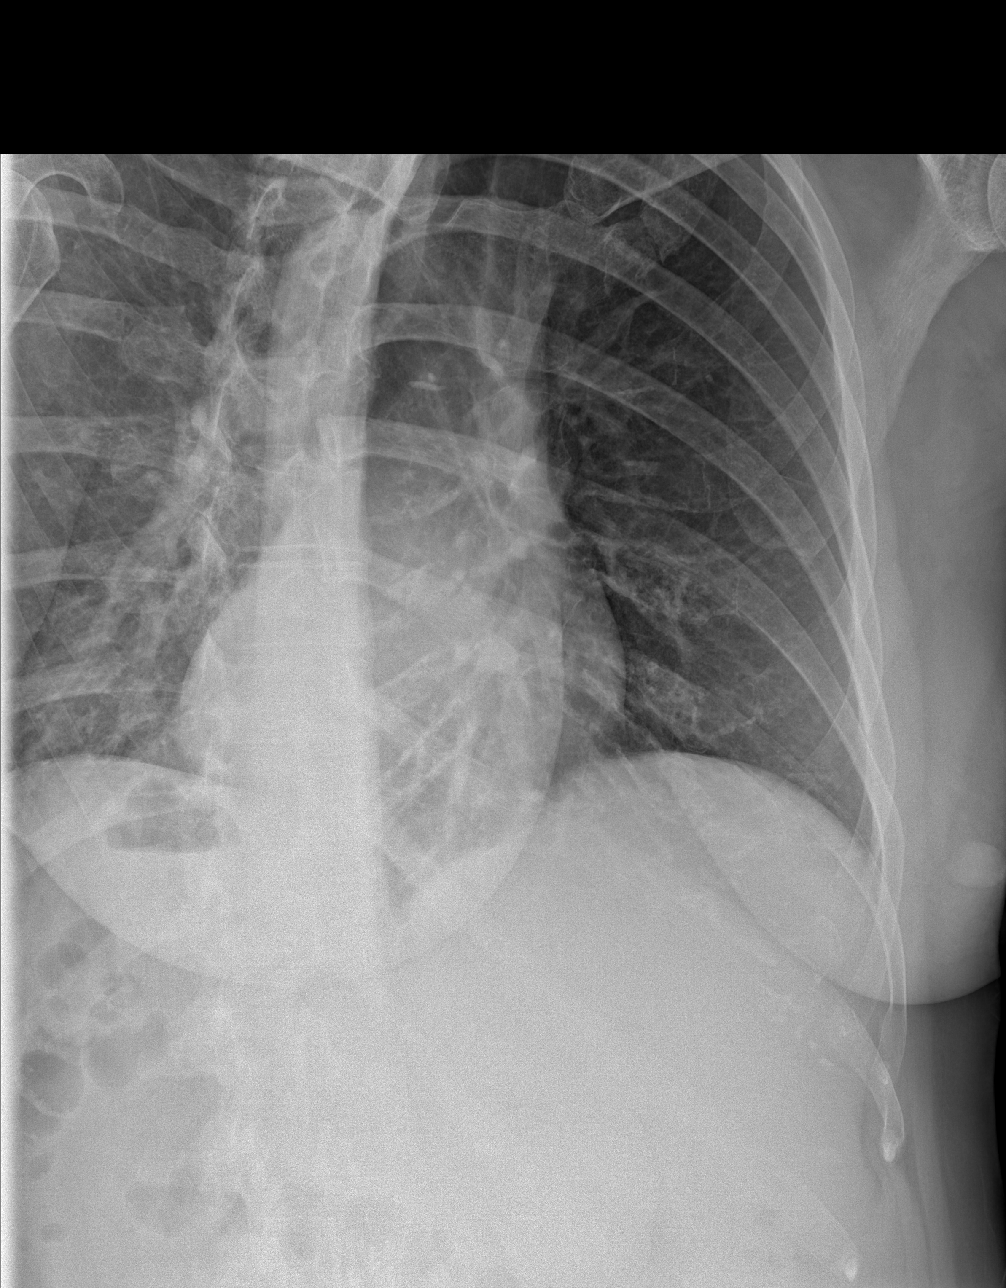

[4 of 4 positions shown; findings below may reference images not displayed]

FINDINGS: No fracture or other bone lesions are seen involving the ribs. There
is no evidence of pneumothorax or pleural effusion. Both lungs are
clear. Heart size and mediastinal contours are within normal limits.
IMPRESSION: Negative.

## 2018-10-27 ENCOUNTER — Other Ambulatory Visit: Payer: Self-pay

## 2018-10-27 ENCOUNTER — Encounter (HOSPITAL_COMMUNITY): Payer: Self-pay | Admitting: *Deleted

## 2018-10-27 ENCOUNTER — Emergency Department (HOSPITAL_COMMUNITY)
Admission: EM | Admit: 2018-10-27 | Discharge: 2018-10-27 | Disposition: A | Discharge status: Home / Self Care | Payer: No Typology Code available for payment source | Attending: Emergency Medicine | Admitting: Emergency Medicine

## 2018-10-27 DIAGNOSIS — K645 Perianal venous thrombosis: Secondary | ICD-10-CM

## 2018-10-27 DIAGNOSIS — F17210 Nicotine dependence, cigarettes, uncomplicated: Secondary | ICD-10-CM | POA: Insufficient documentation

## 2018-10-27 DIAGNOSIS — F17220 Nicotine dependence, chewing tobacco, uncomplicated: Secondary | ICD-10-CM | POA: Insufficient documentation

## 2018-10-27 LAB — COMPREHENSIVE METABOLIC PANEL
ALT: 50 U/L — ABNORMAL HIGH (ref 0–44)
AST: 38 U/L (ref 15–41)
Albumin: 4.1 g/dL (ref 3.5–5.0)
Alkaline Phosphatase: 56 U/L (ref 38–126)
Anion gap: 13 (ref 5–15)
BUN: 18 mg/dL (ref 6–20)
CO2: 24 mmol/L (ref 22–32)
Calcium: 9.8 mg/dL (ref 8.9–10.3)
Chloride: 101 mmol/L (ref 98–111)
Creatinine, Ser: 0.75 mg/dL (ref 0.44–1.00)
GFR calc Af Amer: >60 mL/min (ref >60)
GFR calc non Af Amer: >60 mL/min (ref >60)
Glucose, Bld: 100 mg/dL — ABNORMAL HIGH (ref 70–99)
Potassium: 4.1 mmol/L (ref 3.5–5.1)
Sodium: 138 mmol/L (ref 135–145)
Total Bilirubin: 1 mg/dL (ref 0.3–1.2)
Total Protein: 7.3 g/dL (ref 6.5–8.1)

## 2018-10-27 LAB — I-STAT BETA HCG BLOOD, ED (MC, WL, AP ONLY): I-stat hCG, quantitative: <5 m[IU]/mL (ref <5)

## 2018-10-27 LAB — CBC
HCT: 44.7 % (ref 36.0–46.0)
Hemoglobin: 15.2 g/dL — ABNORMAL HIGH (ref 12.0–15.0)
MCH: 34.7 pg — ABNORMAL HIGH (ref 26.0–34.0)
MCHC: 34 g/dL (ref 30.0–36.0)
MCV: 102.1 fL — ABNORMAL HIGH (ref 80.0–100.0)
Platelets: 179 10*3/uL (ref 150–400)
RBC: 4.38 MIL/uL (ref 3.87–5.11)
RDW: 14.3 % (ref 11.5–15.5)
WBC: 12.5 10*3/uL — ABNORMAL HIGH (ref 4.0–10.5)
nRBC: 0 % (ref 0.0–0.2)

## 2018-10-27 LAB — LIPASE, BLOOD: Lipase: 49 U/L (ref 11–51)

## 2018-10-27 MED ORDER — ONDANSETRON 4 MG PO TBDP
4.0000 mg | ORAL_TABLET | Freq: Once | ORAL | Status: AC
  Administered 2018-10-27: 4 mg via ORAL
  Filled 2018-10-27: qty 1

## 2018-10-27 MED ORDER — SODIUM CHLORIDE 0.9% FLUSH: 3.0000 mL | Freq: Once | INTRAVENOUS | Status: DC

## 2018-10-27 MED ORDER — ONDANSETRON HCL 4 MG/2ML IJ SOLN: 4.0000 mg | Freq: Once | INTRAMUSCULAR | Status: DC

## 2018-10-27 MED ORDER — HYDROMORPHONE HCL 1 MG/ML IJ SOLN: 0.5000 mg | INTRAMUSCULAR | Status: DC | PRN

## 2018-10-27 MED ORDER — HYDROMORPHONE HCL 1 MG/ML IJ SOLN
2.0000 mg | Freq: Once | INTRAMUSCULAR | Status: AC
  Administered 2018-10-27: 2 mg via INTRAMUSCULAR
  Filled 2018-10-27: qty 2

## 2018-10-27 MED ORDER — OXYCODONE-ACETAMINOPHEN 5-325 MG PO TABS
1.0000 | ORAL_TABLET | ORAL | Status: DC | PRN
  Administered 2018-10-27: 1 via ORAL
  Filled 2018-10-27: qty 1

## 2018-10-27 MED ORDER — LIDOCAINE-EPINEPHRINE (PF) 2 %-1:200000 IJ SOLN
10.0000 mL | Freq: Once | INTRAMUSCULAR | Status: AC
  Administered 2018-10-27: 10 mL
  Filled 2018-10-27: qty 20

## 2018-10-27 MED ORDER — HYDROCODONE-ACETAMINOPHEN 5-325 MG PO TABS: 1.0000 | ORAL_TABLET | ORAL | 0 refills | Status: AC | PRN

## 2018-10-27 MED ORDER — HYDROCORT-PRAMOXINE (PERIANAL) 1-1 % EX FOAM: 1.0000 | Freq: Two times a day (BID) | CUTANEOUS | 0 refills | Status: AC

## 2018-10-27 NOTE — Discharge Instructions (Addendum)
Please take MiraLAX.  For the next week please take 1 capful morning and night.  If you are still having hard bowel movements you may increase up to 3 times a day.  Please make sure you are drinking plenty of fluids.  Please schedule a follow-up appointment with your primary care doctor for Tuesday.  I have also given you the information for general surgery, please set up an appointment with them.  If you develop fevers, worsening pain, or have concerns please seek additional medical care and evaluation.  Today you received medications that may make you sleepy or impair your ability to make decisions.  For the next 24 hours please do not drive, operate heavy machinery, care for a small child with out another adult present, or perform any activities that may cause harm to you or someone else if you were to fall asleep or be impaired.   You are being prescribed a medication which may make you sleepy. Please follow up of listed precautions for at least 24 hours after taking one dose.  Please take Ibuprofen (Advil, motrin) and Tylenol (acetaminophen) to relieve your pain.  You may take up to 600 MG (3 pills) of normal strength ibuprofen every 8 hours as needed.  In between doses of ibuprofen you make take tylenol, up to 1,000 mg (two extra strength pills).  Do not take more than 3,000 mg tylenol in a 24 hour period.  Please check all medication labels as many medications such as pain and cold medications may contain tylenol.  Do not drink alcohol while taking these medications.  Do not take other NSAID'S while taking ibuprofen (such as aleve or naproxen).  Please take ibuprofen with food to decrease stomach upset.

## 2018-10-27 NOTE — ED Provider Notes (Signed)
  Physical Exam  BP (!) 140/94 (BP Location: Right Arm)   Pulse 89   Temp 98.3 F (36.8 C) (Oral)   Resp 16   Ht 5' 10.5" (1.791 m)   Wt 72.6 kg   SpO2 100%   BMI 22.63 kg/m    ED Course/Procedures     .Marland KitchenIncision and Drainage  Date/Time: 10/27/2018 8:34 PM Performed by: Sherwood Gambler, MD Authorized by: Sherwood Gambler, MD   Consent:    Consent obtained:  Verbal and written   Consent given by:  Patient Location:    Type:  External thrombosed hemorrhoid   Size:  4   Location:  Anogenital Pre-procedure details:    Skin preparation:  Betadine Anesthesia (see MAR for exact dosages):    Anesthesia method:  Local infiltration   Local anesthetic:  Lidocaine 2% WITH epi Procedure type:    Complexity:  Complex Procedure details:    Incision types:  Single straight   Scalpel blade:  11   Wound management:  Probed and deloculated and irrigated with saline   Drainage characteristics: blood clots.   Drainage amount:  Moderate   Wound treatment:  Wound left open   Packing materials:  None Post-procedure details:    Patient tolerance of procedure:  Tolerated well, no immediate complications    MDM  Patient with thrombosed external hemorrhoid. Incised and as much clot removed as we could. Sitz baths, supportive treatment, outpatient general surgery f/u.       Sherwood Gambler, MD 10/27/18 2217

## 2018-10-27 NOTE — ED Provider Notes (Signed)
[facility] EMERGENCY DEPARTMENT Provider Note   CSN: [id number] Arrival date & time: 10/27/18  1542     History   Chief Complaint Chief Complaint  Patient presents with  . Rectal Pain    HPI [name] is a 48 y.o. female with a past medical history of eczema, status post abdominal hysterectomy, who presents today for evaluation of hemorrhoids.  She reports that 3 weeks ago her dermatologist gave her a steroid shot and placed her on a 3-week taper of steroids for a eczema flare.  She reports that in the past 3 weeks she has had diarrhea.  She denies any abdominal pain.  She states that since Tuesday morning she has had rectal pain and she suspects she has hemorrhoids.  She states that she previously had hemorrhoids when she was pregnant with her child however that was 18 years ago.  She denies any fevers.  No abdominal pain     HPI  History reviewed. No pertinent past medical history.  There are no active problems to display for this patient.   Past Surgical History:  Procedure Laterality Date  . ABDOMINAL HYSTERECTOMY    . TUBAL LIGATION       OB History    Gravida  2   Para  2   Term  2   Preterm      AB      Living  2     SAB      TAB      Ectopic      Multiple      Live Births               Home Medications    Prior to Admission medications   Medication Sig Start Date End Date Taking? Authorizing Provider  HYDROcodone-acetaminophen (NORCO/VICODIN) 5-325 MG tablet Take 1 tablet by mouth every 4 (four) hours as needed. 10/27/18   Lorin Glass, PA-C  hydrocortisone-pramoxine [facility]) rectal foam Place 1 applicator rectally 2 (two) times daily. 10/27/18   Lorin Glass, PA-C  ibuprofen (ADVIL,MOTRIN) 200 MG tablet Take 600 mg by mouth every 6 (six) hours as needed for fever, headache, mild pain, moderate pain or cramping.     [provider]  mupirocin cream (BACTROBAN) 2 % Apply 1 application  topically 2 (two) times daily. Patient not taking: Reported on 12/16/2015 09/08/15   Ward, Ozella Almond, PA-C    Family History Family History  Problem Relation Age of Onset  . Hypertension Maternal Grandmother   . Stroke Maternal Grandmother     Social History Social History   Tobacco Use  . Smoking status: Current Every Day Smoker    Packs/day: 1.00    Types: Cigarettes  . Smokeless tobacco: Current User  Substance Use Topics  . Alcohol use: Yes    Comment: socially  . Drug use: No     Allergies   Iohexol and Sulfa antibiotics   Review of Systems Review of Systems  Constitutional: Negative for chills and fever.  Respiratory: Negative for shortness of breath.   Cardiovascular: Negative for chest pain.  Gastrointestinal: Positive for diarrhea and rectal pain. Negative for abdominal pain, blood in stool and nausea.  Musculoskeletal: Negative for back pain.     Physical Exam Updated Vital Signs BP (!) 140/94 (BP Location: Right Arm)   Pulse 89   Temp 98.3 F (36.8 C) (Oral)   Resp 16   Ht 5' 10.5" (1.791 m)   Wt 72.6  kg   SpO2 100%   BMI 22.63 kg/m   Physical Exam Vitals signs and nursing note reviewed. Exam conducted with a chaperone present.  Constitutional:      General: She is not in acute distress.    Appearance: She is well-developed.  HENT:     Head: Normocephalic and atraumatic.  Eyes:     Conjunctiva/sclera: Conjunctivae normal.  Neck:     Musculoskeletal: Neck supple.  Cardiovascular:     Rate and Rhythm: Normal rate and regular rhythm.     Heart sounds: No murmur.  Pulmonary:     Effort: Pulmonary effort is normal. No respiratory distress.     Breath sounds: Normal breath sounds.  Abdominal:     Palpations: Abdomen is soft.     Tenderness: There is no abdominal tenderness.  Genitourinary:    Comments: External rectal exam with 4cm thrombosed hemorrhoid on the left side.  There is a smaller 1cm hemorrhoid on the right side.  Exam  limited secondary to pain. DRE not performed.  Skin:    General: Skin is warm and dry.  Neurological:     Mental Status: She is alert.      ED Treatments / Results  Labs (all labs ordered are listed, but only abnormal results are displayed) Labs Reviewed  COMPREHENSIVE METABOLIC PANEL - Abnormal; Notable for the following components:      Result Value   Glucose, Bld 100 (*)    ALT 50 (*)    All other components within normal limits  CBC - Abnormal; Notable for the following components:   WBC 12.5 (*)    Hemoglobin 15.2 (*)    MCV 102.1 (*)    MCH 34.7 (*)    All other components within normal limits  LIPASE, BLOOD  URINALYSIS, ROUTINE W REFLEX MICROSCOPIC  I-STAT BETA HCG BLOOD, ED ([facility], [facility], [facility] ONLY)    EKG None  Radiology No results found.  Procedures Procedures (including critical care time) Please see note by Dr. Criss Alvine  Medications Ordered in ED Medications  sodium chloride flush (NS) 0.9 % injection 3 mL (has no administration in time range)  lidocaine-EPINEPHrine (XYLOCAINE W/EPI) 2 %-1:200000 (PF) injection 10 mL (10 mLs Infiltration Given 10/27/18 1938)  ondansetron (ZOFRAN-ODT) disintegrating tablet 4 mg (4 mg Oral Given 10/27/18 1939)  HYDROmorphone (DILAUDID) injection 2 mg (2 mg Intramuscular Given 10/27/18 1940)     Initial Impression / Assessment and Plan / ED Course  I have reviewed the triage vital signs and the nursing notes.  Pertinent labs & imaging results that were available during my care of the patient were reviewed by me and considered in my medical decision making (see chart for details).       Patient presents today for evaluation of rectal pain for 3 days.  On exam she has a large external thrombosed hemorrhoid which is tender to touch.  Discussed possible treatment options with patient including continued sitz bath's and supportive care, referral to surgery, treating her pain, and incision and drainage.  Patient made the informed decision  to undergo incision and drainage.  Please see procedure note from Dr. Gwenlyn Fudge.  I assisted in the procedure.  PMP database was consulted and patient is given a short course of narcotic pain medicine.  She is also instructed to take ibuprofen as needed for pain and perform sitz bath's.  We discussed the importance of MiraLAX and stool softeners especially given use of narcotic pain medicine.  She is given follow-up  with general surgery.  Per her request she was given a prescription for Proctofoam.  Labs are obtained by triage.  Patient does have a mild leukocytosis however she had been taking steroids for the past 3 weeks to treat an eczema flare therefore expect that this is reactive rather than infectious.    Return precautions were discussed with patient who states their understanding.  At the time of discharge patient denied any unaddressed complaints or concerns.  Patient is agreeable for discharge home.    Final Clinical Impressions(s) / ED Diagnoses   Final diagnoses:  Thrombosed hemorrhoids    ED Discharge Orders         Ordered    HYDROcodone-acetaminophen (NORCO/VICODIN) 5-325 MG tablet  Every 4 hours PRN     10/27/18 2035    hydrocortisone-pramoxine (PROCTOFOAM-HC) rectal foam  2 times daily     10/27/18 2035           Norman ClayHammond, Ajiah Mcglinn W, PA-C 10/27/18 2118    Pricilla LovelessGoldston, Scott, MD 10/27/18 2219

## 2018-10-27 NOTE — ED Triage Notes (Signed)
Pt states she has had diarrhea since her dermatologist placed her on steroids.  Hemorrhoids since Tues am and she feels as if she has a rectal prolapse today.  Pt appears in great pain.

## 2018-10-27 NOTE — ED Notes (Signed)
Pt left while I was inanother room

## 2021-11-18 ENCOUNTER — Emergency Department (HOSPITAL_COMMUNITY)
Admission: EM | Admit: 2021-11-18 | Discharge: 2021-11-18 | Discharge status: Against Medical Advice | Payer: No Typology Code available for payment source

## 2021-11-18 NOTE — ED Notes (Signed)
Called x 3 no answer 

## 2021-11-24 ENCOUNTER — Emergency Department (HOSPITAL_COMMUNITY): Payer: No Typology Code available for payment source

## 2021-11-24 ENCOUNTER — Other Ambulatory Visit: Payer: Self-pay

## 2021-11-24 ENCOUNTER — Encounter (HOSPITAL_BASED_OUTPATIENT_CLINIC_OR_DEPARTMENT_OTHER): Payer: Self-pay | Admitting: Pediatrics

## 2021-11-24 ENCOUNTER — Emergency Department (HOSPITAL_BASED_OUTPATIENT_CLINIC_OR_DEPARTMENT_OTHER)
Admission: EM | Admit: 2021-11-24 | Discharge: 2021-11-24 | Disposition: A | Discharge status: Home / Self Care | Payer: No Typology Code available for payment source | Attending: Emergency Medicine | Admitting: Emergency Medicine

## 2021-11-24 ENCOUNTER — Emergency Department (HOSPITAL_BASED_OUTPATIENT_CLINIC_OR_DEPARTMENT_OTHER): Payer: No Typology Code available for payment source

## 2021-11-24 DIAGNOSIS — R202 Paresthesia of skin: Secondary | ICD-10-CM | POA: Insufficient documentation

## 2021-11-24 DIAGNOSIS — R519 Headache, unspecified: Secondary | ICD-10-CM | POA: Insufficient documentation

## 2021-11-24 LAB — URINALYSIS, MICROSCOPIC (REFLEX): WBC, UA: NONE SEEN WBC/hpf (ref 0–5)

## 2021-11-24 LAB — URINALYSIS, ROUTINE W REFLEX MICROSCOPIC
Bilirubin Urine: NEGATIVE
Glucose, UA: NEGATIVE mg/dL
Ketones, ur: >=80 mg/dL — AB
Leukocytes,Ua: NEGATIVE
Nitrite: NEGATIVE
Protein, ur: >=300 mg/dL — AB
Specific Gravity, Urine: >=1.03 (ref 1.005–1.030)
pH: 5.5 (ref 5.0–8.0)

## 2021-11-24 LAB — CBC WITH DIFFERENTIAL/PLATELET
Abs Immature Granulocytes: 0.08 10*3/uL — ABNORMAL HIGH (ref 0.00–0.07)
Basophils Absolute: 0.1 10*3/uL (ref 0.0–0.1)
Basophils Relative: 1 %
Eosinophils Absolute: 0.1 10*3/uL (ref 0.0–0.5)
Eosinophils Relative: 1 %
HCT: 45.2 % (ref 36.0–46.0)
Hemoglobin: 15.5 g/dL — ABNORMAL HIGH (ref 12.0–15.0)
Immature Granulocytes: 1 %
Lymphocytes Relative: 25 %
Lymphs Abs: 2.5 10*3/uL (ref 0.7–4.0)
MCH: 34.1 pg — ABNORMAL HIGH (ref 26.0–34.0)
MCHC: 34.3 g/dL (ref 30.0–36.0)
MCV: 99.3 fL (ref 80.0–100.0)
Monocytes Absolute: 0.6 10*3/uL (ref 0.1–1.0)
Monocytes Relative: 6 %
Neutro Abs: 6.6 10*3/uL (ref 1.7–7.7)
Neutrophils Relative %: 66 %
Platelets: 288 10*3/uL (ref 150–400)
RBC: 4.55 MIL/uL (ref 3.87–5.11)
RDW: 13 % (ref 11.5–15.5)
WBC: 9.9 10*3/uL (ref 4.0–10.5)
nRBC: 0 % (ref 0.0–0.2)

## 2021-11-24 LAB — BASIC METABOLIC PANEL
Anion gap: 12 (ref 5–15)
BUN: 17 mg/dL (ref 6–20)
CO2: 27 mmol/L (ref 22–32)
Calcium: 9.6 mg/dL (ref 8.9–10.3)
Chloride: 99 mmol/L (ref 98–111)
Creatinine, Ser: 0.71 mg/dL (ref 0.44–1.00)
GFR, Estimated: >60 mL/min (ref >60)
Glucose, Bld: 98 mg/dL (ref 70–99)
Potassium: 4.2 mmol/L (ref 3.5–5.1)
Sodium: 138 mmol/L (ref 135–145)

## 2021-11-24 LAB — HEPATIC FUNCTION PANEL
ALT: 59 U/L — ABNORMAL HIGH (ref 0–44)
AST: 45 U/L — ABNORMAL HIGH (ref 15–41)
Albumin: 4.5 g/dL (ref 3.5–5.0)
Alkaline Phosphatase: 58 U/L (ref 38–126)
Bilirubin, Direct: 0.1 mg/dL (ref 0.0–0.2)
Indirect Bilirubin: 1.2 mg/dL — ABNORMAL HIGH (ref 0.3–0.9)
Total Bilirubin: 1.3 mg/dL — ABNORMAL HIGH (ref 0.3–1.2)
Total Protein: 8.8 g/dL — ABNORMAL HIGH (ref 6.5–8.1)

## 2021-11-24 MED ORDER — GADOBUTROL 1 MMOL/ML IV SOLN
8.0000 mL | Freq: Once | INTRAVENOUS | Status: AC | PRN
  Administered 2021-11-24: 8 mL via INTRAVENOUS

## 2021-11-24 MED ORDER — LORAZEPAM 1 MG PO TABS
1.0000 mg | ORAL_TABLET | Freq: Once | ORAL | Status: AC | PRN
  Administered 2021-11-24: 1 mg via ORAL
  Filled 2021-11-24: qty 1

## 2021-11-24 MED ORDER — PREDNISONE 10 MG (21) PO TBPK: ORAL_TABLET | Freq: Every day | ORAL | 0 refills | Status: AC

## 2021-11-24 NOTE — ED Notes (Signed)
Patient accompanied by spouse, ambulatory with no gait issues, NAD. Paperwork given.

## 2021-11-24 NOTE — ED Provider Notes (Signed)
[place] EMERGENCY DEPARTMENT Provider Note   CSN: [id number] Arrival date & time: 11/24/21  1233     History  Chief Complaint  Patient presents with   Numbness    [name] is a 51 y.o. female.  Patient with no pertinent PMH presents with bilateral LE paresthesia for 1 week. Recent history of URI about 2 weeks ago. Developed a headache and was seen at [facility] ED, given a migraine cocktail with improvement. Was seen again at [facility] for UTI and given antibiotics. She is finishing up her antibiotics still. She then developed tingling sensation in both legs and groin area. Endorses saddle anesthesia. Numbing sensation when wiping. Denies stool incontinence, urinary retention or incontinence. Has not had bowel movement in about 2 weeks. Poor appetite. Denies any fever or chills.   The history is provided by the patient.      Home Medications Prior to Admission medications   Medication Sig Start Date End Date Taking? Authorizing Provider  HYDROcodone-acetaminophen (NORCO/VICODIN) 5-325 MG tablet Take 1 tablet by mouth every 4 (four) hours as needed. 10/27/18   Lorin Glass, PA-C  hydrocortisone-pramoxine [facility]) rectal foam Place 1 applicator rectally 2 (two) times daily. 10/27/18   Lorin Glass, PA-C  ibuprofen (ADVIL,MOTRIN) 200 MG tablet Take 600 mg by mouth every 6 (six) hours as needed for fever, headache, mild pain, moderate pain or cramping.     [provider]  mupirocin cream (BACTROBAN) 2 % Apply 1 application topically 2 (two) times daily. Patient not taking: Reported on 12/16/2015 09/08/15   Ward, Ozella Almond, PA-C      Allergies    Iohexol and Sulfa antibiotics    Review of Systems   Review of Systems  Constitutional:  Positive for appetite change and fatigue. Negative for chills and fever.  HENT:  Negative for congestion.   Gastrointestinal:  Positive for constipation.  Neurological:  Positive for weakness, numbness  and headaches. Negative for dizziness and light-headedness.    Physical Exam Updated Vital Signs BP (!) 125/95   Pulse 95   Temp 98.4 F (36.9 C) (Oral)   Resp 17   Ht 5' 10.5" (1.791 m)   Wt 81.6 kg   SpO2 96%   BMI 25.46 kg/m  Physical Exam HENT:     Head: Normocephalic and atraumatic.  Abdominal:     General: Abdomen is flat. Bowel sounds are normal.     Palpations: Abdomen is soft.  Skin:    General: Skin is warm and dry.  Neurological:     General: No focal deficit present.     Mental Status: She is alert.     Motor: Motor function is intact.     Deep Tendon Reflexes:     Reflex Scores:      Patellar reflexes are 2+ on the right side and 2+ on the left side.      Achilles reflexes are 2+ on the right side and 2+ on the left side. Psychiatric:        Mood and Affect: Mood normal.        Behavior: Behavior normal.     ED Results / Procedures / Treatments   Labs (all labs ordered are listed, but only abnormal results are displayed) Labs Reviewed  CBC WITH DIFFERENTIAL/PLATELET - Abnormal; Notable for the following components:      Result Value   Hemoglobin 15.5 (*)    MCH 34.1 (*)    Abs Immature Granulocytes 0.08 (*)  All other components within normal limits  URINALYSIS, ROUTINE W REFLEX MICROSCOPIC - Abnormal; Notable for the following components:   Hgb urine dipstick MODERATE (*)    Ketones, ur >=80 (*)    Protein, ur >=300 (*)    All other components within normal limits  HEPATIC FUNCTION PANEL - Abnormal; Notable for the following components:   Total Protein 8.8 (*)    AST 45 (*)    ALT 59 (*)    Total Bilirubin 1.3 (*)    Indirect Bilirubin 1.2 (*)    All other components within normal limits  URINALYSIS, MICROSCOPIC (REFLEX) - Abnormal; Notable for the following components:   Bacteria, UA RARE (*)    All other components within normal limits  BASIC METABOLIC PANEL    EKG None  Radiology CT Head Wo Contrast  Result Date:  11/24/2021 CLINICAL DATA:  Headache new or worsening. Lower body numbness to touch since having a virus 2 weeks ago. Migraine times 10 days. EXAM: CT HEAD WITHOUT CONTRAST TECHNIQUE: Contiguous axial images were obtained from the base of the skull through the vertex without intravenous contrast. RADIATION DOSE REDUCTION: This exam was performed according to the departmental dose-optimization program which includes automated exposure control, adjustment of the mA and/or kV according to patient size and/or use of iterative reconstruction technique. COMPARISON:  CT September 23, 2011. FINDINGS: Brain: No evidence of acute infarction, hemorrhage, hydrocephalus, extra-axial collection or mass lesion/mass effect. Vascular: No hyperdense vessel or unexpected calcification. Skull: Normal. Negative for fracture or focal lesion. Sinuses/Orbits: Visualized portions of the paranasal sinuses and ethmoid air cells are predominantly clear. Orbits are unremarkable. Other: Mastoid air cells are predominantly clear. Dense cerumen in the bilateral external auditory canals. IMPRESSION: No acute intracranial abnormality. Electronically Signed   By: Maudry Mayhew M.D.   On: 11/24/2021 18:11    Procedures Procedures   Medications Ordered in ED Medications - No data to display  ED Course/ Medical Decision Making/ A&P                           Medical Decision Making Amount and/or Complexity of Data Reviewed Radiology: ordered.  Risk Prescription drug management.  Patient presents with acute bilateral LE paresthesia and saddle anesthesia following recent URI. Differential diagnosis include but not limited to GBS, spinal infarct, cauda equina syndrome or spinal stenosis. UA showed ketones and proteinuria. Could be starvation ketosis due to not eating for past 1-2 weeks. Mildly elevated transaminases. HCT head negative. Discussed need for MRI lumbar and thoracic. Patient agrees and will go to [facility] ED via POV for MRI and further  evaluation.   Final Clinical Impression(s) / ED Diagnoses Final diagnoses:  Paresthesia of both lower extremities    Rx / DC Orders ED Discharge Orders     None         Rana Snare, DO 11/24/21 2118    Alvira Monday, MD 11/25/21 1142

## 2021-11-24 NOTE — ED Triage Notes (Signed)
C/O numbness from the waist down; reported started about 2 weeks ago after feeling unwell with a migraine and was seen at an ER given some migraine cocktails. When asked if she lost control of bowel and bladder, stated she has not had a bowel movement since, and when she pees, she can feel the urgency but does not feel it when she wipes. She stated that she was recently diagnose with UTI and is halfway through her ABX.

## 2021-11-24 NOTE — ED Provider Notes (Signed)
Patient arrived by private vehicle from [place] for MRIs of her lumbar and thoracic spine.  Patient is a 51 year old female with no pertinent past medical history who presented to the emergency department with bilateral lower extremity paresthesias which have been ongoing for 1 week.  Patient did endorse a recent URI and was seen at an outside ED for a headache.  Was also seen at the same outside ED for a urinary tract infection and given antibiotics.  Antibiotics are still in process at this time.  She has developed a tingling sensation in her both legs and groin area and endorses saddle anesthesia.  She is a numbing sensation when wiping.  She denies stool incontinence, urinary retention, urinary incontinence.  Patient states that she has had no bowel movement in 2 weeks.  MRI unavailable at [facility] and patient transferred to [facility] Physical Exam  BP (!) 118/97 (BP Location: Left Arm)   Pulse 100   Temp 98.3 F (36.8 C) (Oral)   Resp 16   Ht 5' 10.5" (1.791 m)   Wt 81.6 kg   SpO2 96%   BMI 25.46 kg/m   Physical Exam Vitals and nursing note reviewed.  Constitutional:      General: She is not in acute distress. HENT:     Head: Normocephalic and atraumatic.  Cardiovascular:     Rate and Rhythm: Normal rate.  Pulmonary:     Effort: Pulmonary effort is normal.  Musculoskeletal:        General: Normal range of motion.  Neurological:     Mental Status: She is alert.     Comments: Patient able to ambulate without difficulty     Procedures  Procedures  ED Course / MDM    Medical Decision Making Amount and/or Complexity of Data Reviewed Radiology: ordered.  Risk Prescription drug management.   I ordered and interpreted MRI imaging of the thoracic and lumbar spine.  1. No thoracic spinal canal or neural foraminal stenosis.  2. Mild left L5-S1 neural foraminal stenosis.  3. 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  I agree with the radiologist findings.  At this time there is no sign of cauda equina or conus medullaris.  Unclear as to the etiology of the patient's numbness.  There is no indication for admission at this time.  Patient will follow-up with outpatient with neurology.  I will send in a steroid Dosepak to see if that may help with symptoms.  Vitals are normal.  Discharge home       Ronny Bacon 11/24/21 2259    Davonna Belling, MD 11/24/21 [phone]

## 2021-11-24 NOTE — Discharge Instructions (Signed)
You were seen today for numbness of bilateral legs. Your MRI's were reassuring for no neurologic emergent conditions. I have placed a referral for neurology to contact you for a follow up appointment. I have also prescribed a steroid dose pack to hopefully alleviate some symptoms. Please return to the emergency department if you develop any life threatening conditions

## 2021-11-24 NOTE — ED Notes (Signed)
Patient to see this RN, A+Ox4, VSS, NAD noted. Patient reports history of UTI & c/o hx HA, N/V x6 days, numbness in BLE, decreased appetite, fatigue/malaise.
